# Patient Record
Sex: Female | Born: 1990 | Race: Black or African American | Hispanic: No | Marital: Single | Smoking: Never smoker
Health system: Southern US, Community
[De-identification: ages and names within clinical notes are randomized; demographics above are authoritative.]

## PROBLEM LIST (undated history)

## (undated) DIAGNOSIS — J45909 Unspecified asthma, uncomplicated: Secondary | ICD-10-CM

## (undated) DIAGNOSIS — I10 Essential (primary) hypertension: Secondary | ICD-10-CM

## (undated) DIAGNOSIS — R87629 Unspecified abnormal cytological findings in specimens from vagina: Secondary | ICD-10-CM

## (undated) DIAGNOSIS — T7840XA Allergy, unspecified, initial encounter: Secondary | ICD-10-CM

## (undated) DIAGNOSIS — O219 Vomiting of pregnancy, unspecified: Secondary | ICD-10-CM

## (undated) DIAGNOSIS — Z3490 Encounter for supervision of normal pregnancy, unspecified, unspecified trimester: Secondary | ICD-10-CM

## (undated) HISTORY — DX: Vomiting of pregnancy, unspecified: O21.9

## (undated) HISTORY — DX: Encounter for supervision of normal pregnancy, unspecified, unspecified trimester: Z34.90

## (undated) HISTORY — DX: Unspecified abnormal cytological findings in specimens from vagina: R87.629

## (undated) HISTORY — DX: Essential (primary) hypertension: I10

## (undated) HISTORY — DX: Unspecified asthma, uncomplicated: J45.909

## (undated) HISTORY — DX: Allergy, unspecified, initial encounter: T78.40XA

---

## 2014-02-09 NOTE — L&D Delivery Note (Cosign Needed)
Delivery Note At 6:40 AM a viable female was delivered via Vaginal, Spontaneous Delivery (Presentation: OA ).  APGAR: , 1; weight 5 lb 2.9 oz (2350 g).  Infant dried and lifted to pt's abd. Heartrate palp in intact cord approx 100bpm. Cord clamped and cut by CNM and infant taken to warmer. Placenta status: Intact, Spontaneous.  Cord: 3 vessels   Cord pH: 7.21  Anesthesia: Epidural  Episiotomy: None Lacerations:  1st degree vag Suture Repair: 3.0 vicryl Est. Blood Loss (mL):  approx 300cc  Mom to AICU.  Baby to NICU.   Cam Hai CNM 10/11/2014, 7:18 AM

## 2014-04-17 ENCOUNTER — Encounter: Payer: Self-pay | Admitting: Adult Health

## 2014-04-17 ENCOUNTER — Ambulatory Visit (INDEPENDENT_AMBULATORY_CARE_PROVIDER_SITE_OTHER): Payer: BLUE CROSS/BLUE SHIELD | Admitting: Adult Health

## 2014-04-17 VITALS — BP 152/90 | HR 87 | Ht 65.0 in | Wt 267.0 lb

## 2014-04-17 DIAGNOSIS — O219 Vomiting of pregnancy, unspecified: Secondary | ICD-10-CM

## 2014-04-17 DIAGNOSIS — Z3201 Encounter for pregnancy test, result positive: Secondary | ICD-10-CM

## 2014-04-17 DIAGNOSIS — Z3490 Encounter for supervision of normal pregnancy, unspecified, unspecified trimester: Secondary | ICD-10-CM

## 2014-04-17 DIAGNOSIS — O09899 Supervision of other high risk pregnancies, unspecified trimester: Secondary | ICD-10-CM | POA: Insufficient documentation

## 2014-04-17 HISTORY — DX: Encounter for supervision of normal pregnancy, unspecified, unspecified trimester: Z34.90

## 2014-04-17 HISTORY — DX: Vomiting of pregnancy, unspecified: O21.9

## 2014-04-17 LAB — POCT URINE PREGNANCY: Preg Test, Ur: POSITIVE

## 2014-04-17 MED ORDER — DOXYLAMINE-PYRIDOXINE 10-10 MG PO TBEC: DELAYED_RELEASE_TABLET | ORAL | Status: DC

## 2014-04-17 MED ORDER — PRENATAL PLUS IRON 29-1 MG PO TABS: ORAL_TABLET | ORAL | Status: DC

## 2014-04-17 NOTE — Patient Instructions (Signed)
Second Trimester of Pregnancy The second trimester is from week 13 through week 28, months 4 through 6. The second trimester is often a time when you feel your best. Your body has also adjusted to being pregnant, and you begin to feel better physically. Usually, morning sickness has lessened or quit completely, you may have more energy, and you may have an increase in appetite. The second trimester is also a time when the fetus is growing rapidly. At the end of the sixth month, the fetus is about 9 inches long and weighs about 1 pounds. You will likely begin to feel the baby move (quickening) between 18 and 20 weeks of the pregnancy. BODY CHANGES Your body goes through many changes during pregnancy. The changes vary from woman to woman.   Your weight will continue to increase. You will notice your lower abdomen bulging out.  You may begin to get stretch marks on your hips, abdomen, and breasts.  You may develop headaches that can be relieved by medicines approved by your health care provider.  You may urinate more often because the fetus is pressing on your bladder.  You may develop or continue to have heartburn as a result of your pregnancy.  You may develop constipation because certain hormones are causing the muscles that push waste through your intestines to slow down.  You may develop hemorrhoids or swollen, bulging veins (varicose veins).  You may have back pain because of the weight gain and pregnancy hormones relaxing your joints between the bones in your pelvis and as a result of a shift in weight and the muscles that support your balance.  Your breasts will continue to grow and be tender.  Your gums may bleed and may be sensitive to brushing and flossing.  Dark spots or blotches (chloasma, mask of pregnancy) may develop on your face. This will likely fade after the baby is born.  A dark line from your belly button to the pubic area (linea nigra) may appear. This will likely fade  after the baby is born.  You may have changes in your hair. These can include thickening of your hair, rapid growth, and changes in texture. Some women also have hair loss during or after pregnancy, or hair that feels dry or thin. Your hair will most likely return to normal after your baby is born. WHAT TO EXPECT AT YOUR PRENATAL VISITS During a routine prenatal visit:  You will be weighed to make sure you and the fetus are growing normally.  Your blood pressure will be taken.  Your abdomen will be measured to track your baby's growth.  The fetal heartbeat will be listened to.  Any test results from the previous visit will be discussed. Your health care provider may ask you:  How you are feeling.  If you are feeling the baby move.  If you have had any abnormal symptoms, such as leaking fluid, bleeding, severe headaches, or abdominal cramping.  If you have any questions. Other tests that may be performed during your second trimester include:  Blood tests that check for:  Low iron levels (anemia).  Gestational diabetes (between 24 and 28 weeks).  Rh antibodies.  Urine tests to check for infections, diabetes, or protein in the urine.  An ultrasound to confirm the proper growth and development of the baby.  An amniocentesis to check for possible genetic problems.  Fetal screens for spina bifida and Down syndrome. HOME CARE INSTRUCTIONS   Avoid all smoking, herbs, alcohol, and unprescribed   drugs. These chemicals affect the formation and growth of the baby.  Follow your health care provider's instructions regarding medicine use. There are medicines that are either safe or unsafe to take during pregnancy.  Exercise only as directed by your health care provider. Experiencing uterine cramps is a good sign to stop exercising.  Continue to eat regular, healthy meals.  Wear a good support bra for breast tenderness.  Do not use hot tubs, steam rooms, or saunas.  Wear your  seat belt at all times when driving.  Avoid raw meat, uncooked cheese, cat litter boxes, and soil used by cats. These carry germs that can cause birth defects in the baby.  Take your prenatal vitamins.  Try taking a stool softener (if your health care provider approves) if you develop constipation. Eat more high-fiber foods, such as fresh vegetables or fruit and whole grains. Drink plenty of fluids to keep your urine clear or pale yellow.  Take warm sitz baths to soothe any pain or discomfort caused by hemorrhoids. Use hemorrhoid cream if your health care provider approves.  If you develop varicose veins, wear support hose. Elevate your feet for 15 minutes, 3-4 times a day. Limit salt in your diet.  Avoid heavy lifting, wear low heel shoes, and practice good posture.  Rest with your legs elevated if you have leg cramps or low back pain.  Visit your dentist if you have not gone yet during your pregnancy. Use a soft toothbrush to brush your teeth and be gentle when you floss.  A sexual relationship may be continued unless your health care provider directs you otherwise.  Continue to go to all your prenatal visits as directed by your health care provider. SEEK MEDICAL CARE IF:   You have dizziness.  You have mild pelvic cramps, pelvic pressure, or nagging pain in the abdominal area.  You have persistent nausea, vomiting, or diarrhea.  You have a bad smelling vaginal discharge.  You have pain with urination. SEEK IMMEDIATE MEDICAL CARE IF:   You have a fever.  You are leaking fluid from your vagina.  You have spotting or bleeding from your vagina.  You have severe abdominal cramping or pain.  You have rapid weight gain or loss.  You have shortness of breath with chest pain.  You notice sudden or extreme swelling of your face, hands, ankles, feet, or legs.  You have not felt your baby move in over an hour.  You have severe headaches that do not go away with  medicine.  You have vision changes. Document Released: 01/20/2001 Document Revised: 01/31/2013 Document Reviewed: 03/29/2012 [employer] Patient Information 2015 [employer]. This information is not intended to replace advice given to you by your health care provider. Make sure you discuss any questions you have with your health care provider. First Trimester of Pregnancy The first trimester of pregnancy is from week 1 until the end of week 12 (months 1 through 3). A week after a sperm fertilizes an egg, the egg will implant on the wall of the uterus. This embryo will begin to develop into a baby. Genes from you and your partner are forming the baby. The female genes determine whether the baby is a boy or a girl. At 6-8 weeks, the eyes and face are formed, and the heartbeat can be seen on ultrasound. At the end of 12 weeks, all the baby's organs are formed.  Now that you are pregnant, you will want to do everything you can to have   a healthy baby. Two of the most important things are to get good prenatal care and to follow your health care provider's instructions. Prenatal care is all the medical care you receive before the baby's birth. This care will help prevent, find, and treat any problems during the pregnancy and childbirth. BODY CHANGES Your body goes through many changes during pregnancy. The changes vary from woman to woman.   You may gain or lose a couple of pounds at first.  You may feel sick to your stomach (nauseous) and throw up (vomit). If the vomiting is uncontrollable, call your health care provider.  You may tire easily.  You may develop headaches that can be relieved by medicines approved by your health care provider.  You may urinate more often. Painful urination may mean you have a bladder infection.  You may develop heartburn as a result of your pregnancy.  You may develop constipation because certain hormones are causing the muscles that push waste through your intestines  to slow down.  You may develop hemorrhoids or swollen, bulging veins (varicose veins).  Your breasts may begin to grow larger and become tender. Your nipples may stick out more, and the tissue that surrounds them (areola) may become darker.  Your gums may bleed and may be sensitive to brushing and flossing.  Dark spots or blotches (chloasma, mask of pregnancy) may develop on your face. This will likely fade after the baby is born.  Your menstrual periods will stop.  You may have a loss of appetite.  You may develop cravings for certain kinds of food.  You may have changes in your emotions from day to day, such as being excited to be pregnant or being concerned that something may go wrong with the pregnancy and baby.  You may have more vivid and strange dreams.  You may have changes in your hair. These can include thickening of your hair, rapid growth, and changes in texture. Some women also have hair loss during or after pregnancy, or hair that feels dry or thin. Your hair will most likely return to normal after your baby is born. WHAT TO EXPECT AT YOUR PRENATAL VISITS During a routine prenatal visit:  You will be weighed to make sure you and the baby are growing normally.  Your blood pressure will be taken.  Your abdomen will be measured to track your baby's growth.  The fetal heartbeat will be listened to starting around week 10 or 12 of your pregnancy.  Test results from any previous visits will be discussed. Your health care provider may ask you:  How you are feeling.  If you are feeling the baby move.  If you have had any abnormal symptoms, such as leaking fluid, bleeding, severe headaches, or abdominal cramping.  If you have any questions. Other tests that may be performed during your first trimester include:  Blood tests to find your blood type and to check for the presence of any previous infections. They will also be used to check for low iron levels (anemia) and  Rh antibodies. Later in the pregnancy, blood tests for diabetes will be done along with other tests if problems develop.  Urine tests to check for infections, diabetes, or protein in the urine.  An ultrasound to confirm the proper growth and development of the baby.  An amniocentesis to check for possible genetic problems.  Fetal screens for spina bifida and Down syndrome.  You may need other tests to make sure you and the baby   are doing well. HOME CARE INSTRUCTIONS  Medicines  Follow your health care provider's instructions regarding medicine use. Specific medicines may be either safe or unsafe to take during pregnancy.  Take your prenatal vitamins as directed.  If you develop constipation, try taking a stool softener if your health care provider approves. Diet  Eat regular, well-balanced meals. Choose a variety of foods, such as meat or vegetable-based protein, fish, milk and low-fat dairy products, vegetables, fruits, and whole grain breads and cereals. Your health care provider will help you determine the amount of weight gain that is right for you.  Avoid raw meat and uncooked cheese. These carry germs that can cause birth defects in the baby.  Eating four or five small meals rather than three large meals a day may help relieve nausea and vomiting. If you start to feel nauseous, eating a few soda crackers can be helpful. Drinking liquids between meals instead of during meals also seems to help nausea and vomiting.  If you develop constipation, eat more high-fiber foods, such as fresh vegetables or fruit and whole grains. Drink enough fluids to keep your urine clear or pale yellow. Activity and Exercise  Exercise only as directed by your health care provider. Exercising will help you:  Control your weight.  Stay in shape.  Be prepared for labor and delivery.  Experiencing pain or cramping in the lower abdomen or low back is a good sign that you should stop exercising. Check  with your health care provider before continuing normal exercises.  Try to avoid standing for long periods of time. Move your legs often if you must stand in one place for a long time.  Avoid heavy lifting.  Wear low-heeled shoes, and practice good posture.  You may continue to have sex unless your health care provider directs you otherwise. Relief of Pain or Discomfort  Wear a good support bra for breast tenderness.   Take warm sitz baths to soothe any pain or discomfort caused by hemorrhoids. Use hemorrhoid cream if your health care provider approves.   Rest with your legs elevated if you have leg cramps or low back pain.  If you develop varicose veins in your legs, wear support hose. Elevate your feet for 15 minutes, 3-4 times a day. Limit salt in your diet. Prenatal Care  Schedule your prenatal visits by the twelfth week of pregnancy. They are usually scheduled monthly at first, then more often in the last 2 months before delivery.  Write down your questions. Take them to your prenatal visits.  Keep all your prenatal visits as directed by your health care provider. Safety  Wear your seat belt at all times when driving.  Make a list of emergency phone numbers, including numbers for family, friends, the hospital, and police and fire departments. General Tips  Ask your health care provider for a referral to a local prenatal education class. Begin classes no later than at the beginning of month 6 of your pregnancy.  Ask for help if you have counseling or nutritional needs during pregnancy. Your health care provider can offer advice or refer you to specialists for help with various needs.  Do not use hot tubs, steam rooms, or saunas.  Do not douche or use tampons or scented sanitary pads.  Do not cross your legs for long periods of time.  Avoid cat litter boxes and soil used by cats. These carry germs that can cause birth defects in the baby and possibly loss of the fetus    by miscarriage or stillbirth.  Avoid all smoking, herbs, alcohol, and medicines not prescribed by your health care provider. Chemicals in these affect the formation and growth of the baby.  Schedule a dentist appointment. At home, brush your teeth with a soft toothbrush and be gentle when you floss. SEEK MEDICAL CARE IF:   You have dizziness.  You have mild pelvic cramps, pelvic pressure, or nagging pain in the abdominal area.  You have persistent nausea, vomiting, or diarrhea.  You have a bad smelling vaginal discharge.  You have pain with urination.  You notice increased swelling in your face, hands, legs, or ankles. SEEK IMMEDIATE MEDICAL CARE IF:   You have a fever.  You are leaking fluid from your vagina.  You have spotting or bleeding from your vagina.  You have severe abdominal cramping or pain.  You have rapid weight gain or loss.  You vomit blood or material that looks like coffee grounds.  You are exposed to German measles and have never had them.  You are exposed to fifth disease or chickenpox.  You develop a severe headache.  You have shortness of breath.  You have any kind of trauma, such as from a fall or a car accident. Document Released: 01/20/2001 Document Revised: 06/12/2013 Document Reviewed: 12/06/2012 [employer] Patient Information 2015 [employer]. This information is not intended to replace advice given to you by your health care provider. Make sure you discuss any questions you have with your health care provider. Return in 1 week for dating [place] 

## 2014-04-17 NOTE — Progress Notes (Signed)
Subjective:     Patient ID: Guerry MinorsChristian Werst, female   DOB: 11-08-1990, 24 y.o.   MRN: [id number]  HPI Ephriam KnucklesChristian is a 24 year old black female in for having missed a period and had +HPT,she complains of some nausea and vomiting.  Review of Systems +missed period +nausea and vomiting, all other systems negative  Reviewed past medical,surgical, social and family history. Reviewed medications and allergies.     Objective:   Physical Exam BP 152/90 mmHg  Pulse 87  Ht 5\' 5"  (1.651 m)  Wt 267 lb (121.11 kg)  BMI 44.43 kg/m2  LMP 12/19/2015UPT +, about 11+[redacted] weeks pregnant with EDD 11/05/14 by LMP, medicaid form given, Skin warm and dry. Neck: mid line trachea, normal thyroid,  Lungs: clear to ausculation bilaterally. Cardiovascular: regular rate and rhythm.abdomen is soft and non tender.    Assessment:     Pregnant +UPT Nausea and vomiting of pregnancy    Plan:     Rx prenatal plus #30 1 daily with 11 refills Trial of diclegis #36 tabs given take 2 at bedtime for 2 nights then on third day 1 in am and 2 at HS then 4th day 1 in am, 1 in afternoon and 2 at hs, no more than 4 a day, lot [id number] exp 07/10/15 Return in 1 week for dating US Review handout on first and second trimester

## 2014-04-24 ENCOUNTER — Other Ambulatory Visit: Payer: BLUE CROSS/BLUE SHIELD

## 2014-04-26 ENCOUNTER — Ambulatory Visit (INDEPENDENT_AMBULATORY_CARE_PROVIDER_SITE_OTHER): Payer: BLUE CROSS/BLUE SHIELD

## 2014-04-26 ENCOUNTER — Other Ambulatory Visit: Payer: Self-pay | Admitting: Adult Health

## 2014-04-26 ENCOUNTER — Encounter: Payer: Self-pay | Admitting: Adult Health

## 2014-04-26 DIAGNOSIS — O3680X1 Pregnancy with inconclusive fetal viability, fetus 1: Secondary | ICD-10-CM

## 2014-04-26 DIAGNOSIS — Z3490 Encounter for supervision of normal pregnancy, unspecified, unspecified trimester: Secondary | ICD-10-CM

## 2014-04-26 NOTE — Progress Notes (Signed)
U/S-(12+5weeks)-single active fetus, CRL c/w LMP dates, cx appears closed, bilateral adnexa appears WNL, FHR-157 bpm, pt refused NT/IT screen,anterior Gr 0 placenta

## 2014-05-02 ENCOUNTER — Encounter: Payer: BLUE CROSS/BLUE SHIELD | Admitting: Advanced Practice Midwife

## 2014-05-16 ENCOUNTER — Encounter: Payer: BLUE CROSS/BLUE SHIELD | Admitting: Women's Health

## 2014-05-22 ENCOUNTER — Ambulatory Visit (INDEPENDENT_AMBULATORY_CARE_PROVIDER_SITE_OTHER): Payer: BLUE CROSS/BLUE SHIELD | Admitting: Advanced Practice Midwife

## 2014-05-22 ENCOUNTER — Other Ambulatory Visit (HOSPITAL_COMMUNITY)
Admission: RE | Admit: 2014-05-22 | Discharge: 2014-05-22 | Disposition: A | Discharge status: Home / Self Care | Payer: Medicaid Other | Source: Ambulatory Visit | Attending: Advanced Practice Midwife | Admitting: Advanced Practice Midwife

## 2014-05-22 ENCOUNTER — Encounter: Payer: Self-pay | Admitting: Advanced Practice Midwife

## 2014-05-22 VITALS — BP 130/78 | HR 76 | Wt 262.4 lb

## 2014-05-22 DIAGNOSIS — J45909 Unspecified asthma, uncomplicated: Secondary | ICD-10-CM | POA: Insufficient documentation

## 2014-05-22 DIAGNOSIS — O0932 Supervision of pregnancy with insufficient antenatal care, second trimester: Secondary | ICD-10-CM

## 2014-05-22 DIAGNOSIS — Z363 Encounter for antenatal screening for malformations: Secondary | ICD-10-CM

## 2014-05-22 DIAGNOSIS — Z124 Encounter for screening for malignant neoplasm of cervix: Secondary | ICD-10-CM

## 2014-05-22 DIAGNOSIS — R87810 Cervical high risk human papillomavirus (HPV) DNA test positive: Secondary | ICD-10-CM | POA: Insufficient documentation

## 2014-05-22 DIAGNOSIS — Z113 Encounter for screening for infections with a predominantly sexual mode of transmission: Secondary | ICD-10-CM | POA: Insufficient documentation

## 2014-05-22 DIAGNOSIS — O0930 Supervision of pregnancy with insufficient antenatal care, unspecified trimester: Secondary | ICD-10-CM | POA: Insufficient documentation

## 2014-05-22 DIAGNOSIS — Z3490 Encounter for supervision of normal pregnancy, unspecified, unspecified trimester: Secondary | ICD-10-CM

## 2014-05-22 DIAGNOSIS — Z0283 Encounter for blood-alcohol and blood-drug test: Secondary | ICD-10-CM

## 2014-05-22 DIAGNOSIS — Z331 Pregnant state, incidental: Secondary | ICD-10-CM

## 2014-05-22 DIAGNOSIS — A5901 Trichomonal vulvovaginitis: Secondary | ICD-10-CM

## 2014-05-22 DIAGNOSIS — Z1389 Encounter for screening for other disorder: Secondary | ICD-10-CM

## 2014-05-22 DIAGNOSIS — Z01411 Encounter for gynecological examination (general) (routine) with abnormal findings: Secondary | ICD-10-CM | POA: Diagnosis present

## 2014-05-22 DIAGNOSIS — Z369 Encounter for antenatal screening, unspecified: Secondary | ICD-10-CM

## 2014-05-22 DIAGNOSIS — Z1151 Encounter for screening for human papillomavirus (HPV): Secondary | ICD-10-CM | POA: Insufficient documentation

## 2014-05-22 DIAGNOSIS — Z3402 Encounter for supervision of normal first pregnancy, second trimester: Secondary | ICD-10-CM | POA: Diagnosis not present

## 2014-05-22 LAB — POCT URINALYSIS DIPSTICK
Blood, UA: NEGATIVE
GLUCOSE UA: NEGATIVE
KETONES UA: NEGATIVE
NITRITE UA: NEGATIVE
Protein, UA: 1

## 2014-05-22 LAB — OB RESULTS CONSOLE HIV ANTIBODY (ROUTINE TESTING): HIV: NONREACTIVE

## 2014-05-22 LAB — OB RESULTS CONSOLE RUBELLA ANTIBODY, IGM: Rubella: IMMUNE

## 2014-05-22 MED ORDER — METRONIDAZOLE 500 MG PO TABS: 2000.0000 mg | ORAL_TABLET | Freq: Once | ORAL | Status: DC

## 2014-05-22 NOTE — Patient Instructions (Addendum)
Safe Medications in Pregnancy   Acne: Benzoyl Peroxide Salicylic Acid  Backache/Headache: Tylenol: 2 regular strength every 4 hours OR              2 Extra strength every 6 hours  Colds/Coughs/Allergies: Benadryl (alcohol free) 25 mg every 6 hours as needed Breath right strips Claritin Cepacol throat lozenges Chloraseptic throat spray Cold-Eeze- up to three times per day Cough drops, alcohol free Flonase (by prescription only) Guaifenesin Mucinex Robitussin DM (plain only, alcohol free) Saline nasal spray/drops Sudafed (pseudoephedrine) & Actifed ** use only after [redacted] weeks gestation and if you do not have high blood pressure Tylenol Vicks Vaporub Zinc lozenges Zyrtec   Constipation: Colace Ducolax suppositories Fleet enema Glycerin suppositories Metamucil Milk of magnesia Miralax Senokot Smooth move tea  Diarrhea: Kaopectate Imodium A-D  *NO pepto Bismol  Hemorrhoids: Anusol Anusol HC Preparation H Tucks  Indigestion: Tums Maalox Mylanta Zantac  Pepcid  Insomnia: Benadryl (alcohol free) 25mg  every 6 hours as needed Tylenol PM Unisom, no Gelcaps  Leg Cramps: Tums MagGel  Nausea/Vomiting:  Bonine Dramamine Emetrol Ginger extract Sea bands Meclizine  Nausea medication to take during pregnancy:  Unisom (doxylamine succinate 25 mg tablets) Take one tablet daily at bedtime. If symptoms are not adequately controlled, the dose can be increased to a maximum recommended dose of two tablets daily (1/2 tablet in the morning, 1/2 tablet mid-afternoon and one at bedtime). Vitamin B6 100mg  tablets. Take one tablet twice a day (up to 200 mg per day).  Skin Rashes: Aveeno products Benadryl cream or 25mg  every 6 hours as needed Calamine Lotion 1% cortisone cream  Yeast infection: Gyne-lotrimin 7 Monistat 7   **If taking multiple medications, please check labels to avoid duplicating the same active ingredients **take medication as directed on  the label ** Do not exceed 4000 mg of tylenol in 24 hours **Do not take medications that contain aspirin or ibuprofen    Trichomoniasis Trichomoniasis is an infection caused by an organism called Trichomonas. The infection can affect both women and men. In women, the outer female genitalia and the vagina are affected. In men, the penis is mainly affected, but the prostate and other reproductive organs can also be involved. Trichomoniasis is a sexually transmitted infection (STI) and is most often passed to another person through sexual contact.  RISK FACTORS  Having unprotected sexual intercourse.  Having sexual intercourse with an infected partner. SIGNS AND SYMPTOMS  Symptoms of trichomoniasis in women include:  Abnormal gray-green frothy vaginal discharge.  Itching and irritation of the vagina.  Itching and irritation of the area outside the vagina. Symptoms of trichomoniasis in men include:   Penile discharge with or without pain.  Pain during urination. This results from inflammation of the urethra. DIAGNOSIS  Trichomoniasis may be found during a Pap test or physical exam. Your health care provider may use one of the following methods to help diagnose this infection:  Examining vaginal discharge under a microscope. For men, urethral discharge would be examined.  Testing the pH of the vagina with a test tape.  Using a vaginal swab test that checks for the Trichomonas organism. A test is available that provides results within a few minutes.  Doing a culture test for the organism. This is not usually needed. TREATMENT   You may be given medicine to fight the infection. Women should inform their health care provider if they could be or are pregnant. Some medicines used to treat the infection should not be taken during pregnancy.  Your health care provider may recommend over-the-counter medicines or creams to decrease itching or irritation.  Your sexual partner will need to  be treated if infected. HOME CARE INSTRUCTIONS   Take medicines only as directed by your health care provider.  Take over-the-counter medicine for itching or irritation as directed by your health care provider.  Do not have sexual intercourse while you have the infection.  Women should not douche or wear tampons while they have the infection.  Discuss your infection with your partner. Your partner may have gotten the infection from you, or you may have gotten it from your partner.  Have your sex partner get examined and treated if necessary.  Practice safe, informed, and protected sex.  See your health care provider for other STI testing. SEEK MEDICAL CARE IF:   You still have symptoms after you finish your medicine.  You develop abdominal pain.  You have pain when you urinate.  You have bleeding after sexual intercourse.  You develop a rash.  Your medicine makes you sick or makes you throw up (vomit). MAKE SURE YOU:  Understand these instructions.  Will watch your condition.  Will get help right away if you are not doing well or get worse. Document Released: 07/22/2000 Document Revised: 06/12/2013 Document Reviewed: 11/07/2012 [facility] Patient Information 2015 [place], [place]. This information is not intended to replace advice given to you by your health care provider. Make sure you discuss any questions you have with your health care provider.

## 2014-05-22 NOTE — Progress Notes (Signed)
  Subjective:    Heather Huff is a G1P0 4681w3d being seen today for her first obstetrical visit.  Her obstetrical history is significant [facility].  Pregnancy history fully reviewed.  Patient reports vaginal irritation since before she got pregnant.  C/O yellow vaginal discharge as well.    Filed Vitals:   05/22/14 1055  BP: 130/78  Pulse: 76  Weight: 262 lb 6.4 oz (119.024 kg)    HISTORY: OB History  Gravida Para Term Preterm AB SAB TAB Ectopic Multiple Living  1             # Outcome Date GA Lbr Len/2nd Weight Sex Delivery Anes PTL Lv  1 Current              Past Medical History  Diagnosis Date  . Asthma   . Allergy     seasonal  . Pregnant 04/17/2014  . Nausea and vomiting during pregnancy 04/17/2014   History reviewed. No pertinent past surgical history. Family History  Problem Relation Age of Onset  . Hypertension Mother   . Thyroid disease Mother   . Asthma Sister   . Allergies Brother   . Arthritis Maternal Grandmother   . Hypertension Maternal Grandmother      Exam       Pelvic Exam:    Perineum: Normal Perineum   Vulva: normal   Vagina:  normal mucosa, no palpable nodules.  Frothy yellow discharge and vagina very erythemous.  WET PREP:  TNTC WBC, + trich.  No yeast or clue   Uterus Normal, Gravid, FH: 16      Cervix: normal   Adnexa: Not palpable   Urinary:  urethral meatus normal    System: Breast:  normal appearance, no masses or tenderness   Skin: normal coloration and turgor, no rashes    Neurologic: oriented, normal, normal mood   Extremities: normal strength, tone, and muscle mass   HEENT PERRLA   Mouth/Teeth mucous membranes moist, normal dentition   Neck supple and no masses   Cardiovascular: regular rate and rhythm   Respiratory:  appears well, vitals normal, no respiratory distress, acyanotic   Abdomen: soft, non-tender;  FHR: 150          Assessment:    Pregnancy: G1P0 Patient Active Problem List   Diagnosis  Date Noted  . Late prenatal care 05/22/2014  . Pregnant 04/17/2014  . Nausea and vomiting during pregnancy 04/17/2014        Plan:     Initial labs drawn. Continue prenatal vitamins  Flagyl 2mg  PO for Pt and Partner Problem list reviewed and updated  Reviewed recommended weight gain based on pre-gravid BMI  Encouraged well-balanced diet Genetic Screening discussed Quad Screen: declined.  Ultrasound discussed; fetal survey: requested.  Follow up in 3 weeks for anatomy scan, POC trich  CRESENZO-DISHMAN,Iolanda Folson 05/22/2014

## 2014-05-24 LAB — CYTOLOGY - PAP

## 2014-05-24 LAB — URINE CULTURE: Organism ID, Bacteria: NO GROWTH

## 2014-05-28 LAB — URINALYSIS, ROUTINE W REFLEX MICROSCOPIC
BILIRUBIN UA: NEGATIVE
Glucose, UA: NEGATIVE
Nitrite, UA: NEGATIVE
PH UA: 5.5 (ref 5.0–7.5)
UUROB: 0.2 mg/dL (ref 0.2–1.0)

## 2014-05-28 LAB — MICROSCOPIC EXAMINATION
Casts: NONE SEEN /lpf
Epithelial Cells (non renal): >10 /hpf — AB (ref 0–10)
WBC, UA: >30 /hpf — AB (ref 0–?)

## 2014-05-28 LAB — PMP SCREEN PROFILE (10S), URINE
Amphetamine Screen, Ur: NEGATIVE ng/mL
BARBITURATE SCRN UR: NEGATIVE ng/mL
BENZODIAZEPINE SCREEN, URINE: NEGATIVE ng/mL
CANNABINOIDS UR QL SCN: NEGATIVE ng/mL
CREATININE(CRT), U: 324.9 mg/dL — AB (ref 20.0–300.0)
Cocaine(Metab.)Screen, Urine: NEGATIVE ng/mL
Methadone Scn, Ur: NEGATIVE ng/mL
OXYCODONE+OXYMORPHONE UR QL SCN: NEGATIVE ng/mL
Opiate Scrn, Ur: NEGATIVE ng/mL
PCP Scrn, Ur: NEGATIVE ng/mL
Ph of Urine: 5.6 (ref 4.5–8.9)
Propoxyphene, Screen: NEGATIVE ng/mL

## 2014-05-28 LAB — CBC
HEMATOCRIT: 33.7 % — AB (ref 34.0–46.6)
Hemoglobin: 10.6 g/dL — ABNORMAL LOW (ref 11.1–15.9)
MCH: 21.7 pg — AB (ref 26.6–33.0)
MCHC: 31.5 g/dL (ref 31.5–35.7)
MCV: 69 fL — ABNORMAL LOW (ref 79–97)
Platelets: 229 10*3/uL (ref 150–379)
RBC: 4.88 x10E6/uL (ref 3.77–5.28)
RDW: 22.5 % — ABNORMAL HIGH (ref 12.3–15.4)
WBC: 8.7 10*3/uL (ref 3.4–10.8)

## 2014-05-28 LAB — RUBELLA SCREEN: RUBELLA: 1.11 {index} (ref >0.99)

## 2014-05-28 LAB — CYSTIC FIBROSIS MUTATION 97: Interpretation: NOT DETECTED

## 2014-05-28 LAB — ABO/RH: Rh Factor: POSITIVE

## 2014-05-28 LAB — VARICELLA ZOSTER ANTIBODY, IGG: VARICELLA: 2097 {index} (ref >165)

## 2014-05-28 LAB — HEPATITIS B SURFACE ANTIGEN: Hepatitis B Surface Ag: NEGATIVE

## 2014-05-28 LAB — RPR: RPR: NONREACTIVE

## 2014-05-28 LAB — SICKLE CELL SCREEN: SICKLE CELL SCREEN: NEGATIVE

## 2014-05-28 LAB — HIV ANTIBODY (ROUTINE TESTING W REFLEX): HIV SCREEN 4TH GENERATION: NONREACTIVE

## 2014-05-28 LAB — ANTIBODY SCREEN: Antibody Screen: NEGATIVE

## 2014-05-30 ENCOUNTER — Telehealth: Payer: Self-pay | Admitting: Women's Health

## 2014-05-30 ENCOUNTER — Other Ambulatory Visit: Payer: Self-pay | Admitting: Advanced Practice Midwife

## 2014-05-30 DIAGNOSIS — A749 Chlamydial infection, unspecified: Secondary | ICD-10-CM | POA: Insufficient documentation

## 2014-05-30 DIAGNOSIS — A5901 Trichomonal vulvovaginitis: Secondary | ICD-10-CM

## 2014-05-30 DIAGNOSIS — IMO0002 Reserved for concepts with insufficient information to code with codable children: Secondary | ICD-10-CM

## 2014-05-30 DIAGNOSIS — O0932 Supervision of pregnancy with insufficient antenatal care, second trimester: Secondary | ICD-10-CM

## 2014-05-30 MED ORDER — AZITHROMYCIN 500 MG PO TABS: 1000.0000 mg | ORAL_TABLET | Freq: Every day | ORAL | Status: DC

## 2014-05-30 NOTE — Progress Notes (Unsigned)
+   CHl 05/30/2014  Azithromycin 1gm for pt and partner. POC one month

## 2014-05-30 NOTE — Telephone Encounter (Signed)
Pt aware of blood work results. Pt stated that she already knew about her pap smear and she was taking the medication that Drenda FreezeFran prescribed her.

## 2014-06-01 ENCOUNTER — Other Ambulatory Visit: Payer: Self-pay | Admitting: Advanced Practice Midwife

## 2014-06-01 DIAGNOSIS — Z363 Encounter for antenatal screening for malformations: Secondary | ICD-10-CM

## 2014-06-01 DIAGNOSIS — O99210 Obesity complicating pregnancy, unspecified trimester: Secondary | ICD-10-CM

## 2014-06-05 ENCOUNTER — Encounter: Payer: Self-pay | Admitting: Women's Health

## 2014-06-05 ENCOUNTER — Ambulatory Visit (INDEPENDENT_AMBULATORY_CARE_PROVIDER_SITE_OTHER): Payer: BLUE CROSS/BLUE SHIELD | Admitting: Women's Health

## 2014-06-05 ENCOUNTER — Ambulatory Visit (INDEPENDENT_AMBULATORY_CARE_PROVIDER_SITE_OTHER): Payer: BLUE CROSS/BLUE SHIELD

## 2014-06-05 VITALS — BP 132/80 | HR 88 | Wt 262.0 lb

## 2014-06-05 DIAGNOSIS — A749 Chlamydial infection, unspecified: Secondary | ICD-10-CM

## 2014-06-05 DIAGNOSIS — Z331 Pregnant state, incidental: Secondary | ICD-10-CM

## 2014-06-05 DIAGNOSIS — IMO0002 Reserved for concepts with insufficient information to code with codable children: Secondary | ICD-10-CM

## 2014-06-05 DIAGNOSIS — O99210 Obesity complicating pregnancy, unspecified trimester: Secondary | ICD-10-CM

## 2014-06-05 DIAGNOSIS — Z36 Encounter for antenatal screening of mother: Secondary | ICD-10-CM

## 2014-06-05 DIAGNOSIS — O0932 Supervision of pregnancy with insufficient antenatal care, second trimester: Secondary | ICD-10-CM

## 2014-06-05 DIAGNOSIS — Z3402 Encounter for supervision of normal first pregnancy, second trimester: Secondary | ICD-10-CM

## 2014-06-05 DIAGNOSIS — Z363 Encounter for antenatal screening for malformations: Secondary | ICD-10-CM

## 2014-06-05 DIAGNOSIS — Z1389 Encounter for screening for other disorder: Secondary | ICD-10-CM

## 2014-06-05 DIAGNOSIS — A5901 Trichomonal vulvovaginitis: Secondary | ICD-10-CM

## 2014-06-05 LAB — POCT URINALYSIS DIPSTICK
Blood, UA: NEGATIVE
Glucose, UA: NEGATIVE
KETONES UA: NEGATIVE
Nitrite, UA: NEGATIVE
Protein, UA: NEGATIVE

## 2014-06-05 NOTE — Progress Notes (Signed)
US  6282w3d measurements c/w dates,breech,ant pl 3 cm from pl,normal ov's,sdp of fluid 3.8cm,limited anatomy scan, limited view of spine,heart,feet,and cord insertion.

## 2014-06-05 NOTE — Progress Notes (Signed)
Low-risk OB appointment G1P0 7911w3d Estimated Date of Delivery: 11/03/14 BP 146/94 mmHg  Pulse 88  Wt 262 lb (118.842 kg)  LMP 01/27/2014 BP recheck 132/80 BP, weight, and urine reviewed.  Refer to obstetrical flow sheet for FH & FHR.  No fm yet. Denies cramping, lof, vb, or uti s/s. No h/o HTN. Unaware of +CT or abnormal pap- notified pt & partner of both. Pt had taken both rx's for flagyl b/c continued to have d/c. Partner has not taken rx for flagyl. Flagyl 2gm po x 1 [facility] [birth year], [facility]. Pt[facility]ner [facility] per notes in chart. No sex x 7d from time both take all medication. Will do POC for trich & CT next visit. Also needs colpo next visit.  Reviewed today's anatomy u/s- limited views- repeat at 28wks. Discussed warning s/s to report. Plan:  Continue routine obstetrical care  F/U in 4wks for OB appointment, CT & trich poc, and colpo

## 2014-06-15 ENCOUNTER — Telehealth: Payer: Self-pay | Admitting: *Deleted

## 2014-06-15 NOTE — Telephone Encounter (Signed)
Spoke with pt. Pt has had + trich and + CHL recently. Pt was treated but not sure if partner was treated. She has had sex. Pt has a vaginal discharge again. I advised she would need to be seen so discharge can be checked. Pt voiced understanding. Call transferred to front desk for appt. JSY

## 2014-06-18 ENCOUNTER — Other Ambulatory Visit: Payer: Self-pay | Admitting: Advanced Practice Midwife

## 2014-06-18 ENCOUNTER — Encounter: Payer: BLUE CROSS/BLUE SHIELD | Admitting: Women's Health

## 2014-06-22 ENCOUNTER — Other Ambulatory Visit: Payer: Self-pay | Admitting: Obstetrics & Gynecology

## 2014-06-22 ENCOUNTER — Ambulatory Visit (INDEPENDENT_AMBULATORY_CARE_PROVIDER_SITE_OTHER): Payer: BLUE CROSS/BLUE SHIELD

## 2014-06-22 ENCOUNTER — Ambulatory Visit (INDEPENDENT_AMBULATORY_CARE_PROVIDER_SITE_OTHER): Payer: BLUE CROSS/BLUE SHIELD | Admitting: Obstetrics & Gynecology

## 2014-06-22 VITALS — BP 140/90 | HR 76 | Wt 263.0 lb

## 2014-06-22 DIAGNOSIS — A749 Chlamydial infection, unspecified: Secondary | ICD-10-CM

## 2014-06-22 DIAGNOSIS — O0932 Supervision of pregnancy with insufficient antenatal care, second trimester: Secondary | ICD-10-CM

## 2014-06-22 DIAGNOSIS — O99212 Obesity complicating pregnancy, second trimester: Secondary | ICD-10-CM | POA: Diagnosis not present

## 2014-06-22 DIAGNOSIS — Z3402 Encounter for supervision of normal first pregnancy, second trimester: Secondary | ICD-10-CM

## 2014-06-22 DIAGNOSIS — IMO0002 Reserved for concepts with insufficient information to code with codable children: Secondary | ICD-10-CM

## 2014-06-22 DIAGNOSIS — Z1389 Encounter for screening for other disorder: Secondary | ICD-10-CM

## 2014-06-22 DIAGNOSIS — Z0489 Encounter for examination and observation for other specified reasons: Secondary | ICD-10-CM

## 2014-06-22 DIAGNOSIS — Z331 Pregnant state, incidental: Secondary | ICD-10-CM

## 2014-06-22 DIAGNOSIS — A5901 Trichomonal vulvovaginitis: Secondary | ICD-10-CM

## 2014-06-22 LAB — POCT URINALYSIS DIPSTICK
Blood, UA: NEGATIVE
GLUCOSE UA: NEGATIVE
Ketones, UA: NEGATIVE
Nitrite, UA: NEGATIVE
Protein, UA: 1

## 2014-06-22 NOTE — Progress Notes (Signed)
G1P0 6850w6d Estimated Date of Delivery: 11/03/14  Blood pressure 140/90, pulse 76, weight 263 lb (119.296 kg), last menstrual period 01/27/2014.   BP weight and urine results all reviewed and noted.  Please refer to the obstetrical flow sheet for the fundal height and fetal heart rate documentation:  Patient reports good fetal movement, denies any bleeding and no rupture of membranes symptoms or regular contractions. Patient is without complaints. All questions were answered.  Plan:  Continued routine obstetrical care,   Follow up in 3 weeks for OB appointment, colposcopy, TOC  Sonogram is read report done

## 2014-06-22 NOTE — Progress Notes (Signed)
US [redacted]W[redacted]D measurements c/w dates,post pl, pos fht 158bpm,afi sdp 3.8cm,efw 368g,normal ov's bilat,limited view of spine,CI and heels,?prominent stomach.

## 2014-07-03 ENCOUNTER — Encounter: Payer: BLUE CROSS/BLUE SHIELD | Admitting: Women's Health

## 2014-07-13 ENCOUNTER — Ambulatory Visit (INDEPENDENT_AMBULATORY_CARE_PROVIDER_SITE_OTHER): Payer: BLUE CROSS/BLUE SHIELD | Admitting: Obstetrics & Gynecology

## 2014-07-13 ENCOUNTER — Encounter: Payer: BLUE CROSS/BLUE SHIELD | Admitting: Obstetrics & Gynecology

## 2014-07-13 ENCOUNTER — Encounter: Payer: Self-pay | Admitting: Obstetrics & Gynecology

## 2014-07-13 VITALS — BP 130/90 | HR 75 | Wt 264.0 lb

## 2014-07-13 DIAGNOSIS — Z331 Pregnant state, incidental: Secondary | ICD-10-CM

## 2014-07-13 DIAGNOSIS — Z1389 Encounter for screening for other disorder: Secondary | ICD-10-CM

## 2014-07-13 DIAGNOSIS — Z3402 Encounter for supervision of normal first pregnancy, second trimester: Secondary | ICD-10-CM

## 2014-07-13 DIAGNOSIS — O359XX1 Maternal care for (suspected) fetal abnormality and damage, unspecified, fetus 1: Secondary | ICD-10-CM

## 2014-07-13 LAB — POCT URINALYSIS DIPSTICK
Blood, UA: NEGATIVE
Glucose, UA: NEGATIVE
Ketones, UA: NEGATIVE
NITRITE UA: NEGATIVE

## 2014-07-13 MED ORDER — TERCONAZOLE 0.4 % VA CREA: 1.0000 | TOPICAL_CREAM | Freq: Every day | VAGINAL | Status: DC

## 2014-07-13 NOTE — Addendum Note (Signed)
Addended by: Federico FlakeNES, PEGGY A on: 07/13/2014 11:43 AM   Modules accepted: Medications

## 2014-07-13 NOTE — Addendum Note (Signed)
Addended by: Criss AlvinePULLIAM, Tachina Spoonemore G on: 07/13/2014 11:35 AM   Modules accepted: Orders

## 2014-07-13 NOTE — Progress Notes (Signed)
Colpo could not be performed today due to severe yeast infection, will try again next time    Needs sonogram repeated due to prominent stomach bubble along with her 2 hour GTT  G1P0 7951w6d Estimated Date of Delivery: 11/03/14  Last menstrual period 01/27/2014.   BP weight and urine results all reviewed and noted.  Please refer to the obstetrical flow sheet for the fundal height and fetal heart rate documentation:  Patient reports good fetal movement, denies any bleeding and no rupture of membranes symptoms or regular contractions. Patient is without complaints. All questions were answered.  Plan:  Continued routine obstetrical care,   Follow up in 4 weeks for OB appointment, sonogram PN2 and colpo

## 2014-07-16 NOTE — Progress Notes (Signed)
This encounter was created in error - please disregard.

## 2014-08-14 ENCOUNTER — Other Ambulatory Visit: Payer: BLUE CROSS/BLUE SHIELD

## 2014-08-14 ENCOUNTER — Encounter: Payer: Self-pay | Admitting: Obstetrics & Gynecology

## 2014-08-14 ENCOUNTER — Ambulatory Visit (INDEPENDENT_AMBULATORY_CARE_PROVIDER_SITE_OTHER): Payer: BLUE CROSS/BLUE SHIELD

## 2014-08-14 ENCOUNTER — Ambulatory Visit (INDEPENDENT_AMBULATORY_CARE_PROVIDER_SITE_OTHER): Payer: BLUE CROSS/BLUE SHIELD | Admitting: Obstetrics & Gynecology

## 2014-08-14 VITALS — BP 142/98 | HR 84 | Wt 267.0 lb

## 2014-08-14 DIAGNOSIS — O359XX1 Maternal care for (suspected) fetal abnormality and damage, unspecified, fetus 1: Secondary | ICD-10-CM

## 2014-08-14 DIAGNOSIS — A749 Chlamydial infection, unspecified: Secondary | ICD-10-CM

## 2014-08-14 DIAGNOSIS — Z131 Encounter for screening for diabetes mellitus: Secondary | ICD-10-CM

## 2014-08-14 DIAGNOSIS — Z3402 Encounter for supervision of normal first pregnancy, second trimester: Secondary | ICD-10-CM

## 2014-08-14 DIAGNOSIS — A5901 Trichomonal vulvovaginitis: Secondary | ICD-10-CM

## 2014-08-14 DIAGNOSIS — Z3493 Encounter for supervision of normal pregnancy, unspecified, third trimester: Secondary | ICD-10-CM

## 2014-08-14 DIAGNOSIS — Z369 Encounter for antenatal screening, unspecified: Secondary | ICD-10-CM

## 2014-08-14 DIAGNOSIS — Z1389 Encounter for screening for other disorder: Secondary | ICD-10-CM

## 2014-08-14 DIAGNOSIS — Z331 Pregnant state, incidental: Secondary | ICD-10-CM

## 2014-08-14 DIAGNOSIS — O0932 Supervision of pregnancy with insufficient antenatal care, second trimester: Secondary | ICD-10-CM

## 2014-08-14 LAB — POCT URINALYSIS DIPSTICK
Blood, UA: NEGATIVE
Glucose, UA: NEGATIVE
LEUKOCYTES UA: NEGATIVE
Nitrite, UA: NEGATIVE

## 2014-08-14 NOTE — Progress Notes (Signed)
G1P0 372w3d Estimated Date of Delivery: 11/03/14  Blood pressure 142/98, pulse 84, weight 267 lb (121.11 kg), last menstrual period 01/27/2014.   BP weight and urine results all reviewed and noted.  Please refer to the obstetrical flow sheet for the fundal height and fetal heart rate documentation:  Patient reports good fetal movement, denies any bleeding and no rupture of membranes symptoms or regular contractions. Patient is without complaints. All questions were answered.  Plan:  Continued routine obstetrical care, watch BP, borderline chronic  Follow up in 2 weeks for OB appointment,   Could not do colpo long engorged vagina repeat attempt at 8 weeks post partum

## 2014-08-14 NOTE — Progress Notes (Signed)
Pt denies any problems or concerns at this time.  

## 2014-08-14 NOTE — Progress Notes (Signed)
US 28+3WKS,measurements c/w dates,efw 1273g 56.1%,cephalic,normal ov's bilat,afi 11cm,fht 152bpm,cx 4.2cm,ant pl gr 1,stomach appears to be wnl

## 2014-08-15 LAB — CBC
HEMOGLOBIN: 11.1 g/dL (ref 11.1–15.9)
Hematocrit: 34.9 % (ref 34.0–46.6)
MCH: 24.8 pg — AB (ref 26.6–33.0)
MCHC: 31.8 g/dL (ref 31.5–35.7)
MCV: 78 fL — AB (ref 79–97)
Platelets: 178 10*3/uL (ref 150–379)
RBC: 4.47 x10E6/uL (ref 3.77–5.28)
RDW: 21 % — ABNORMAL HIGH (ref 12.3–15.4)
WBC: 9.8 10*3/uL (ref 3.4–10.8)

## 2014-08-15 LAB — GLUCOSE TOLERANCE, 2 HOURS W/ 1HR
Glucose, 1 hour: 143 mg/dL (ref 65–179)
Glucose, 2 hour: 131 mg/dL (ref 65–152)
Glucose, Fasting: 76 mg/dL (ref 65–91)

## 2014-08-15 LAB — ANTIBODY SCREEN: ANTIBODY SCREEN: NEGATIVE

## 2014-08-15 LAB — HSV 2 ANTIBODY, IGG: HSV 2 Glycoprotein G Ab, IgG: <0.91 index (ref 0.00–0.90)

## 2014-08-15 LAB — HIV ANTIBODY (ROUTINE TESTING W REFLEX): HIV Screen 4th Generation wRfx: NONREACTIVE

## 2014-08-15 LAB — RPR: RPR Ser Ql: NONREACTIVE

## 2014-08-28 ENCOUNTER — Encounter: Payer: BLUE CROSS/BLUE SHIELD | Admitting: Women's Health

## 2014-08-29 ENCOUNTER — Ambulatory Visit (INDEPENDENT_AMBULATORY_CARE_PROVIDER_SITE_OTHER): Payer: BLUE CROSS/BLUE SHIELD | Admitting: Women's Health

## 2014-08-29 ENCOUNTER — Encounter: Payer: Self-pay | Admitting: Women's Health

## 2014-08-29 VITALS — BP 144/98 | HR 80 | Wt 268.0 lb

## 2014-08-29 DIAGNOSIS — Z331 Pregnant state, incidental: Secondary | ICD-10-CM

## 2014-08-29 DIAGNOSIS — A749 Chlamydial infection, unspecified: Secondary | ICD-10-CM

## 2014-08-29 DIAGNOSIS — A599 Trichomoniasis, unspecified: Secondary | ICD-10-CM | POA: Diagnosis not present

## 2014-08-29 DIAGNOSIS — Z1389 Encounter for screening for other disorder: Secondary | ICD-10-CM

## 2014-08-29 DIAGNOSIS — O98819 Other maternal infectious and parasitic diseases complicating pregnancy, unspecified trimester: Secondary | ICD-10-CM

## 2014-08-29 DIAGNOSIS — O98319 Other infections with a predominantly sexual mode of transmission complicating pregnancy, unspecified trimester: Secondary | ICD-10-CM

## 2014-08-29 DIAGNOSIS — R896 Abnormal cytological findings in specimens from other organs, systems and tissues: Secondary | ICD-10-CM

## 2014-08-29 DIAGNOSIS — O10919 Unspecified pre-existing hypertension complicating pregnancy, unspecified trimester: Secondary | ICD-10-CM

## 2014-08-29 DIAGNOSIS — A5901 Trichomonal vulvovaginitis: Secondary | ICD-10-CM

## 2014-08-29 DIAGNOSIS — O10912 Unspecified pre-existing hypertension complicating pregnancy, second trimester: Secondary | ICD-10-CM

## 2014-08-29 DIAGNOSIS — O09893 Supervision of other high risk pregnancies, third trimester: Secondary | ICD-10-CM

## 2014-08-29 DIAGNOSIS — IMO0002 Reserved for concepts with insufficient information to code with codable children: Secondary | ICD-10-CM

## 2014-08-29 LAB — POCT URINALYSIS DIPSTICK
Blood, UA: NEGATIVE
GLUCOSE UA: NEGATIVE
KETONES UA: NEGATIVE
NITRITE UA: NEGATIVE
Protein, UA: NEGATIVE

## 2014-08-29 LAB — POCT WET PREP (WET MOUNT): Clue Cells Wet Prep Whiff POC: POSITIVE

## 2014-08-29 LAB — OB RESULTS CONSOLE GC/CHLAMYDIA
Chlamydia: NEGATIVE
Gonorrhea: NEGATIVE

## 2014-08-29 MED ORDER — METRONIDAZOLE 500 MG PO TABS: 500.0000 mg | ORAL_TABLET | Freq: Two times a day (BID) | ORAL | Status: DC

## 2014-08-29 NOTE — Patient Instructions (Addendum)
Call the office [phone]) or go to [facility] if:  You begin to have strong, frequent contractions  Your water breaks.  Sometimes it is a big gush of fluid, sometimes it is just a trickle that keeps getting your panties wet or running down your legs  You have vaginal bleeding.  It is normal to have a small amount of spotting if your cervix was checked.   You don't feel your baby moving like normal.  If you don't, get you something to eat and drink and lay down and focus on feeling your baby move.  You should feel at least 10 movements in 2 hours.  If you don't, you should call the office or go to [facility].    [place] Pediatricians/Family Doctors:  [facility] [phone]            [facility] [phone]                 [facility] [phone] (usually not accepting new patients unless you have family there already, you are always welcome to call and ask)            [facility] ([address] [place]) [phone]   [facility]:   [facility]: [phone]  [facility]: [phone]   Third Trimester of Pregnancy The third trimester is from week 29 through week 42, months 7 through 9. The third trimester is a time when the fetus is growing rapidly. At the end of the ninth month, the fetus is about 20 inches in length and weighs 6-10 pounds.  BODY CHANGES Your body goes through many changes during pregnancy. The changes vary from woman to woman.  6. Your weight will continue to increase. You can expect to gain 25-35 pounds (11-16 kg) by the end of the pregnancy. 7. You may begin to get stretch marks on your hips, abdomen, and breasts. 8. You may urinate more often because the fetus is moving lower into your pelvis and pressing on your bladder. 9. You may develop or continue to have heartburn as a result of your pregnancy. 10. You may develop constipation  because certain hormones are causing the muscles that push waste through your intestines to slow down. 11. You may develop hemorrhoids or swollen, bulging veins (varicose veins). 12. You may have pelvic pain because of the weight gain and pregnancy hormones relaxing your joints between the bones in your pelvis. Backaches may result from overexertion of the muscles supporting your posture. 13. You may have changes in your hair. These can include thickening of your hair, rapid growth, and changes in texture. Some women also have hair loss during or after pregnancy, or hair that feels dry or thin. Your hair will most likely return to normal after your baby is born. 14. Your breasts will continue to grow and be tender. A yellow discharge may leak from your breasts called colostrum. 15. Your belly button may stick out. 16. You may feel short of breath because of your expanding uterus. 17. You may notice the fetus "dropping," or moving lower in your abdomen. 18. You may have a bloody mucus discharge. This usually occurs a few days to a week before labor begins. 19. Your cervix becomes thin and soft (effaced) near your due date. WHAT TO EXPECT AT YOUR PRENATAL EXAMS  You will have prenatal exams every 2 weeks until week 36. Then, you will have weekly prenatal exams. During a routine prenatal visit: 2.  You will be weighed to make sure you and the fetus are growing normally. 3. Your blood pressure is taken. 4. Your abdomen will be measured to track your baby's growth. 5. The fetal heartbeat will be listened to. 6. Any test results from the previous visit will be discussed. 7. You may have a cervical check near your due date to see if you have effaced. At around 36 weeks, your caregiver will check your cervix. At the same time, your caregiver will also perform a test on the secretions of the vaginal tissue. This test is to determine if a type of bacteria, Group B streptococcus, is present. Your caregiver  will explain this further. Your caregiver may ask you: 2. What your birth plan is. 3. How you are feeling. 4. If you are feeling the baby move. 5. If you have had any abnormal symptoms, such as leaking fluid, bleeding, severe headaches, or abdominal cramping. 6. If you have any questions. Other tests or screenings that may be performed during your third trimester include: 2. Blood tests that check for low iron levels (anemia). 3. Fetal testing to check the health, activity level, and growth of the fetus. Testing is done if you have certain medical conditions or if there are problems during the pregnancy. FALSE LABOR You may feel small, irregular contractions that eventually go away. These are called Braxton Hicks contractions, or false labor. Contractions may last for hours, days, or even weeks before true labor sets in. If contractions come at regular intervals, intensify, or become painful, it is best to be seen by your caregiver.  SIGNS OF LABOR  3. Menstrual-like cramps. 4. Contractions that are 5 minutes apart or less. 5. Contractions that start on the top of the uterus and spread down to the lower abdomen and back. 6. A sense of increased pelvic pressure or back pain. 7. A watery or bloody mucus discharge that comes from the vagina. If you have any of these signs before the 37th week of pregnancy, call your caregiver right away. You need to go to the hospital to get checked immediately. HOME CARE INSTRUCTIONS   Avoid all smoking, herbs, alcohol, and unprescribed drugs. These chemicals affect the formation and growth of the baby.  Follow your caregiver's instructions regarding medicine use. There are medicines that are either safe or unsafe to take during pregnancy.  Exercise only as directed by your caregiver. Experiencing uterine cramps is a good sign to stop exercising.  Continue to eat regular, healthy meals.  Wear a good support bra for breast tenderness.  Do not use hot  tubs, steam rooms, or saunas.  Wear your seat belt at all times when driving.  Avoid raw meat, uncooked cheese, cat litter boxes, and soil used by cats. These carry germs that can cause birth defects in the baby.  Take your prenatal vitamins.  Try taking a stool softener (if your caregiver approves) if you develop constipation. Eat more high-fiber foods, such as fresh vegetables or fruit and whole grains. Drink plenty of fluids to keep your urine clear or pale yellow.  Take warm sitz baths to soothe any pain or discomfort caused by hemorrhoids. Use hemorrhoid cream if your caregiver approves.  If you develop varicose veins, wear support hose. Elevate your feet for 15 minutes, 3-4 times a day. Limit salt in your diet.  Avoid heavy lifting, wear low heal shoes, and practice good posture.  Rest a lot with your legs elevated if you have leg cramps or low  back pain.  Visit your dentist if you have not gone during your pregnancy. Use a soft toothbrush to brush your teeth and be gentle when you floss.  A sexual relationship may be continued unless your caregiver directs you otherwise.  Do not travel far distances unless it is absolutely necessary and only with the approval of your caregiver.  Take prenatal classes to understand, practice, and ask questions about the labor and delivery.  Make a trial run to the hospital.  Pack your hospital bag.  Prepare the baby's nursery.  Continue to go to all your prenatal visits as directed by your caregiver. SEEK MEDICAL CARE IF:  You are unsure if you are in labor or if your water has broken.  You have dizziness.  You have mild pelvic cramps, pelvic pressure, or nagging pain in your abdominal area.  You have persistent nausea, vomiting, or diarrhea.  You have a bad smelling vaginal discharge.  You have pain with urination. SEEK IMMEDIATE MEDICAL CARE IF:   You have a fever.  You are leaking fluid from your vagina.  You have spotting  or bleeding from your vagina.  You have severe abdominal cramping or pain.  You have rapid weight loss or gain.  You have shortness of breath with chest pain.  You notice sudden or extreme swelling of your face, hands, ankles, feet, or legs.  You have not felt your baby move in over an hour.  You have severe headaches that do not go away with medicine.  You have vision changes. Document Released: 01/20/2001 Document Revised: 01/31/2013 Document Reviewed: 03/29/2012 [facility] Patient Information 2015 [place]. This information is not intended to replace advice given to you by your health care provider. Make sure you discuss any questions you have with your health care provider.  Bacterial Vaginosis Bacterial vaginosis is a vaginal infection that occurs when the normal balance of bacteria in the vagina is disrupted. It results from an overgrowth of certain bacteria. This is the most common vaginal infection in women of childbearing age. Treatment is important to prevent complications, especially in pregnant women, as it can cause a premature delivery. CAUSES  Bacterial vaginosis is caused by an increase in harmful bacteria that are normally present in smaller amounts in the vagina. Several different kinds of bacteria can cause bacterial vaginosis. However, the reason that the condition develops is not fully understood. RISK FACTORS Certain activities or behaviors can put you at an increased risk of developing bacterial vaginosis, including: 20. Having a new sex partner or multiple sex partners. 21. Douching. 22. Using an intrauterine device (IUD) for contraception. Women do not get bacterial vaginosis from toilet seats, bedding, swimming pools, or contact with objects around them. SIGNS AND SYMPTOMS  Some women with bacterial vaginosis have no signs or symptoms. Common symptoms include: 8. Grey vaginal discharge. 9. A fishlike odor with discharge, especially after sexual  intercourse. 10. Itching or burning of the vagina and vulva. 11. Burning or pain with urination. DIAGNOSIS  Your health care provider will take a medical history and examine the vagina for signs of bacterial vaginosis. A sample of vaginal fluid may be taken. Your health care provider will look at this sample under a microscope to check for bacteria and abnormal cells. A vaginal pH test may also be done.  TREATMENT  Bacterial vaginosis may be treated with antibiotic medicines. These may be given in the form of a pill or a vaginal cream. A second round of antibiotics may be  prescribed if the condition comes back after treatment.  HOME CARE INSTRUCTIONS  7. Only take over-the-counter or prescription medicines as directed by your health care provider. 8. If antibiotic medicine was prescribed, take it as directed. Make sure you finish it even if you start to feel better. 9. Do not have sex until treatment is completed. 10. Tell all sexual partners that you have a vaginal infection. They should see their health care provider and be treated if they have problems, such as a mild rash or itching. 11. Practice safe sex by using condoms and only having one sex partner. SEEK MEDICAL CARE IF:  4. Your symptoms are not improving after 3 days of treatment. 5. You have increased discharge or pain. 6. You have a fever. MAKE SURE YOU:  8. Understand these instructions. 9. Will watch your condition. 10. Will get help right away if you are not doing well or get worse. FOR MORE INFORMATION  Centers for Disease Control and Prevention, Division of STD Prevention: [employer]s.co.za American Sexual Health Association (ASHA): www.ashastd.org  Document Released: 01/26/2005 Document Revised: 11/16/2012 Document Reviewed: 09/07/2012 [facility] Patient Information 2015 [place], [place]. This information is not intended to replace advice given to you by your health care provider. Make sure you discuss any questions you have  with your health care provider.

## 2014-08-29 NOTE — Progress Notes (Addendum)
High Risk Pregnancy Diagnosis(es): CHTN G1P0 173w4d Estimated Date of Delivery: 11/03/14 BP 144/98 mmHg  Pulse 80  Wt 268 lb (121.564 kg)  LMP 01/27/2014  Urinalysis: Negative HPI:  Leaking fluid x 2 wks BP, weight, and urine reviewed.  Reports good fm. Denies regular uc's, vb, uti s/s.   Fundal Height:  32 Fetal Heart rate:  159 Edema:  none SSE: cx visually closed, no pooling, no change w/ valsalva, mod amount thin yellow malodorous d/c, nitrazine & fern neg, wet prep neg trich, + clues=BV, rx flagyl 500mg  bid x 7d  Reviewed ptl s/s, pn2 results, fkc All questions were answered Assessment: 2273w4d CHTN Medication(s) Plans:  None today Treatment Plan:  U/s for efw/afi, begin 2x/wk testing at 32wks, deliver at 39wks (meds) or 40wks (no meds) Follow up in 2d for high-risk OB appt & u/s CT POC today, Trich POC today neg

## 2014-08-31 LAB — GC/CHLAMYDIA PROBE AMP
Chlamydia trachomatis, NAA: NEGATIVE
NEISSERIA GONORRHOEAE BY PCR: NEGATIVE

## 2014-09-02 ENCOUNTER — Other Ambulatory Visit: Payer: Self-pay | Admitting: Women's Health

## 2014-09-02 DIAGNOSIS — O163 Unspecified maternal hypertension, third trimester: Secondary | ICD-10-CM

## 2014-09-03 ENCOUNTER — Ambulatory Visit (INDEPENDENT_AMBULATORY_CARE_PROVIDER_SITE_OTHER): Payer: BLUE CROSS/BLUE SHIELD

## 2014-09-03 ENCOUNTER — Ambulatory Visit (INDEPENDENT_AMBULATORY_CARE_PROVIDER_SITE_OTHER): Payer: BLUE CROSS/BLUE SHIELD | Admitting: Obstetrics and Gynecology

## 2014-09-03 VITALS — BP 152/104 | HR 77 | Wt 269.5 lb

## 2014-09-03 DIAGNOSIS — O163 Unspecified maternal hypertension, third trimester: Secondary | ICD-10-CM

## 2014-09-03 DIAGNOSIS — Z331 Pregnant state, incidental: Secondary | ICD-10-CM

## 2014-09-03 DIAGNOSIS — A749 Chlamydial infection, unspecified: Secondary | ICD-10-CM

## 2014-09-03 DIAGNOSIS — O10913 Unspecified pre-existing hypertension complicating pregnancy, third trimester: Secondary | ICD-10-CM

## 2014-09-03 DIAGNOSIS — O0933 Supervision of pregnancy with insufficient antenatal care, third trimester: Secondary | ICD-10-CM

## 2014-09-03 DIAGNOSIS — A5901 Trichomonal vulvovaginitis: Secondary | ICD-10-CM

## 2014-09-03 DIAGNOSIS — Z1389 Encounter for screening for other disorder: Secondary | ICD-10-CM

## 2014-09-03 DIAGNOSIS — O09893 Supervision of other high risk pregnancies, third trimester: Secondary | ICD-10-CM

## 2014-09-03 LAB — POCT URINALYSIS DIPSTICK
Glucose, UA: NEGATIVE
Ketones, UA: NEGATIVE
Leukocytes, UA: NEGATIVE
NITRITE UA: NEGATIVE
RBC UA: NEGATIVE

## 2014-09-03 MED ORDER — LABETALOL HCL 200 MG PO TABS: 200.0000 mg | ORAL_TABLET | Freq: Two times a day (BID) | ORAL | Status: DC

## 2014-09-03 NOTE — Progress Notes (Signed)
Patient ID: Heather Huff, female   DOB: 03/13/1990, 24 y.o.   MRN: [id number]  G1P0 [redacted]w[redacted]d Estimated Date of Delivery: 11/03/14  Blood pressure 152/104, pulse 77, weight 269 lb 8 oz (122.244 kg), last menstrual period 01/27/2014.   refer to the ob flow sheet for FH and FHR, also BP, Wt, Urine results:notable for negative  Patient reports + good fetal movement, denies any bleeding and no rupture of membranes symptoms or regular contractions. Patient complaints: Pt is here today for evaluation for hypertension. She has had consistent elevated BP since the start of pregnancy. At this visit, her BP measured 152/104. Pt denies a history of HTN. She also denies fluid loss, contractions, vaginal bleeding, HA, swelling, nausea and vomiting.   FH-32 cm FHR-141 bpm  Questions were answered. AssessChronic HTN [redacted]w[redacted]d    Rule out Pre-E  Plan:  Continued routine obstetrical care. Check cbc, cmet, 24 hr tp Prescribe Labetalol 200 bid  F/u in 4 days for nst, bp check  This chart was scribed for Tilda Burrow, MD by Gwenyth Ober, Medical Scribe. This patient was seen in room 1 and the patient's care was started at 2:32 PM.   I personally performed the services described in this documentation, which was SCRIBED in my presence. The recorded information has been reviewed and considered accurate. It has been edited as necessary during review. Tilda Burrow, MD

## 2014-09-03 NOTE — Progress Notes (Signed)
[place] 31+2wks,EFW 1720g 46.7%,cephalic,ant pl gr 2,AFI 8.6cm,fht 132bpm,adnexa's wnl

## 2014-09-03 NOTE — Progress Notes (Signed)
Pt states that she is unable to take the Flagyl she was given, it makes her sick and she throws it back up.

## 2014-09-04 LAB — COMPREHENSIVE METABOLIC PANEL
ALBUMIN: 3.5 g/dL (ref 3.5–5.5)
ALT: 15 IU/L (ref 0–32)
AST: 17 IU/L (ref 0–40)
Albumin/Globulin Ratio: 1.1 (ref 1.1–2.5)
Alkaline Phosphatase: 144 IU/L — ABNORMAL HIGH (ref 39–117)
BUN / CREAT RATIO: 11 (ref 8–20)
BUN: 6 mg/dL (ref 6–20)
Bilirubin Total: 0.3 mg/dL (ref 0.0–1.2)
CHLORIDE: 101 mmol/L (ref 97–108)
CO2: 18 mmol/L (ref 18–29)
Calcium: 9.1 mg/dL (ref 8.7–10.2)
Creatinine, Ser: 0.56 mg/dL — ABNORMAL LOW (ref 0.57–1.00)
GFR calc Af Amer: 153 mL/min/{1.73_m2} (ref >59)
GFR, EST NON AFRICAN AMERICAN: 133 mL/min/{1.73_m2} (ref >59)
GLOBULIN, TOTAL: 3.2 g/dL (ref 1.5–4.5)
GLUCOSE: 77 mg/dL (ref 65–99)
POTASSIUM: 4 mmol/L (ref 3.5–5.2)
Sodium: 136 mmol/L (ref 134–144)
Total Protein: 6.7 g/dL (ref 6.0–8.5)

## 2014-09-04 LAB — CBC
Hematocrit: 37.2 % (ref 34.0–46.6)
Hemoglobin: 12 g/dL (ref 11.1–15.9)
MCH: 24.8 pg — ABNORMAL LOW (ref 26.6–33.0)
MCHC: 32.3 g/dL (ref 31.5–35.7)
MCV: 77 fL — AB (ref 79–97)
Platelets: 194 10*3/uL (ref 150–379)
RBC: 4.84 x10E6/uL (ref 3.77–5.28)
RDW: 19.7 % — ABNORMAL HIGH (ref 12.3–15.4)
WBC: 9.6 10*3/uL (ref 3.4–10.8)

## 2014-09-06 ENCOUNTER — Other Ambulatory Visit: Payer: Self-pay | Admitting: Obstetrics and Gynecology

## 2014-09-06 ENCOUNTER — Ambulatory Visit (INDEPENDENT_AMBULATORY_CARE_PROVIDER_SITE_OTHER): Payer: BLUE CROSS/BLUE SHIELD | Admitting: Obstetrics & Gynecology

## 2014-09-06 ENCOUNTER — Ambulatory Visit: Payer: BLUE CROSS/BLUE SHIELD

## 2014-09-06 VITALS — BP 152/110 | HR 84 | Wt 270.0 lb

## 2014-09-06 DIAGNOSIS — O10913 Unspecified pre-existing hypertension complicating pregnancy, third trimester: Secondary | ICD-10-CM

## 2014-09-06 DIAGNOSIS — Z1389 Encounter for screening for other disorder: Secondary | ICD-10-CM

## 2014-09-06 DIAGNOSIS — Z331 Pregnant state, incidental: Secondary | ICD-10-CM

## 2014-09-06 DIAGNOSIS — O09893 Supervision of other high risk pregnancies, third trimester: Secondary | ICD-10-CM

## 2014-09-06 LAB — POCT URINALYSIS DIPSTICK
Blood, UA: NEGATIVE
Glucose, UA: NEGATIVE
Ketones, UA: NEGATIVE
Nitrite, UA: NEGATIVE

## 2014-09-06 MED ORDER — LABETALOL HCL 200 MG PO TABS: 200.0000 mg | ORAL_TABLET | Freq: Two times a day (BID) | ORAL | Status: DC

## 2014-09-06 NOTE — Progress Notes (Signed)
Fetal Surveillance Testing today:  Reactive NST   High Risk Pregnancy Diagnosis(es):   Chronic Hypertension, pt BP had been borderline throughout her pregnancy on review but now certinly need treatment, she did not get her BP meds filled so I had our nurse Chrystal Pulliam take her to [facility] to get it filled  G1P0 [redacted]w[redacted]d Estimated Date of Delivery: 11/03/14  Blood pressure 152/110, pulse 84, weight 270 lb (122.471 kg), last menstrual period 01/27/2014.  Urinalysis: Negative   HPI: The patient is being seen today for ongoing management of Chronic hypertension. Today she reports no complaints   BP weight and urine results all reviewed and noted. Patient reports good fetal movement, denies any bleeding and no rupture of membranes symptoms or regular contractions.  Fundal Height:  36 Fetal Heart rate:  140 Edema:  trace  Patient is without complaints other than noted in her HPI. All questions were answered.  All lab and sonogram results have been reviewed. Comments: abnormal: BP   Assessment:  1.  Pregnancy at [redacted]w[redacted]d,  Estimated Date of Delivery: 11/03/14 :                          2.  Chronic hypertension, was borderline now need treatment                        3.    Medication(s) Plans:  Labetalol 200 BID  Treatment Plan:  Twice weekly surveillance  Follow up in monday weeks for appointment for high risk OB care, sonogram

## 2014-09-07 LAB — PROTEIN,TOTAL,URINE: Protein, Ur: 50.3 mg/dL

## 2014-09-10 ENCOUNTER — Other Ambulatory Visit: Payer: BLUE CROSS/BLUE SHIELD

## 2014-09-10 ENCOUNTER — Encounter: Payer: BLUE CROSS/BLUE SHIELD | Admitting: Obstetrics and Gynecology

## 2014-09-11 NOTE — Addendum Note (Signed)
Addended by: Criss Alvine on: 09/11/2014 11:42 AM   Modules accepted: Orders

## 2014-09-13 ENCOUNTER — Other Ambulatory Visit: Payer: BLUE CROSS/BLUE SHIELD

## 2014-09-13 ENCOUNTER — Encounter: Payer: BLUE CROSS/BLUE SHIELD | Admitting: Obstetrics and Gynecology

## 2014-09-18 ENCOUNTER — Ambulatory Visit (INDEPENDENT_AMBULATORY_CARE_PROVIDER_SITE_OTHER): Payer: BLUE CROSS/BLUE SHIELD | Admitting: Obstetrics and Gynecology

## 2014-09-18 ENCOUNTER — Encounter: Payer: Self-pay | Admitting: Obstetrics and Gynecology

## 2014-09-18 ENCOUNTER — Ambulatory Visit (INDEPENDENT_AMBULATORY_CARE_PROVIDER_SITE_OTHER): Payer: BLUE CROSS/BLUE SHIELD

## 2014-09-18 VITALS — BP 128/86 | HR 88 | Wt 269.0 lb

## 2014-09-18 DIAGNOSIS — Z331 Pregnant state, incidental: Secondary | ICD-10-CM

## 2014-09-18 DIAGNOSIS — A749 Chlamydial infection, unspecified: Secondary | ICD-10-CM

## 2014-09-18 DIAGNOSIS — O0933 Supervision of pregnancy with insufficient antenatal care, third trimester: Secondary | ICD-10-CM

## 2014-09-18 DIAGNOSIS — Z1389 Encounter for screening for other disorder: Secondary | ICD-10-CM

## 2014-09-18 DIAGNOSIS — O10913 Unspecified pre-existing hypertension complicating pregnancy, third trimester: Secondary | ICD-10-CM

## 2014-09-18 DIAGNOSIS — O09893 Supervision of other high risk pregnancies, third trimester: Secondary | ICD-10-CM

## 2014-09-18 DIAGNOSIS — A5901 Trichomonal vulvovaginitis: Secondary | ICD-10-CM

## 2014-09-18 LAB — POCT URINALYSIS DIPSTICK
GLUCOSE UA: NEGATIVE
Ketones, UA: NEGATIVE
LEUKOCYTES UA: NEGATIVE
Nitrite, UA: NEGATIVE
Protein, UA: 1
RBC UA: NEGATIVE

## 2014-09-18 NOTE — Progress Notes (Addendum)
[place] 33+3wks,efw 2190g 44.8%,cephalic,BPP 8/8,RI .68,.61,ant pl gr 2,bilat adnexa wnl,afi 11.5cm

## 2014-09-18 NOTE — Progress Notes (Signed)
Pt states that her throat feels like there is something caught in it and there is nothing in there. Pt states that she tried to take the Flagyl but the pills made her throw up. . Pt to use tinidazole High Risk Pregnancy Diagnosis(es):    Chronic htn preg 33+3 wk  Blood pressure 128/86, pulse 88, weight 269 lb (122.018 kg), last menstrual period 01/27/2014. HPI: G1P0 [redacted]w[redacted]d Estimated Date of Delivery: 11/03/14     No PIH sx     BP weight and urine results reviewed and notable for 1 + protein again. Patient reports    good fetal movement, denies any bleeding and no rupture of membranes symptoms or regular contractions .  Fundal Height:  34 Fetal Heart rate:  145 Edema:  neg Urinalysis: Positive for 1+ protein  .Assessment:[redacted]w[redacted]d,   Chtn,   Medication(s) Plans:  Continue labetalol  Treatment Plan:         Biweekly testing NST alternating with BPP Follow up:           0.5 weeks for  NST/Bpp All questions were answered.

## 2014-09-21 ENCOUNTER — Encounter: Payer: Self-pay | Admitting: Obstetrics & Gynecology

## 2014-09-21 ENCOUNTER — Ambulatory Visit (INDEPENDENT_AMBULATORY_CARE_PROVIDER_SITE_OTHER): Payer: BLUE CROSS/BLUE SHIELD | Admitting: Obstetrics & Gynecology

## 2014-09-21 VITALS — BP 130/90 | HR 84 | Wt 271.0 lb

## 2014-09-21 DIAGNOSIS — O10913 Unspecified pre-existing hypertension complicating pregnancy, third trimester: Secondary | ICD-10-CM | POA: Diagnosis not present

## 2014-09-21 DIAGNOSIS — Z331 Pregnant state, incidental: Secondary | ICD-10-CM

## 2014-09-21 DIAGNOSIS — Z1389 Encounter for screening for other disorder: Secondary | ICD-10-CM

## 2014-09-21 DIAGNOSIS — O10919 Unspecified pre-existing hypertension complicating pregnancy, unspecified trimester: Secondary | ICD-10-CM

## 2014-09-21 DIAGNOSIS — O09893 Supervision of other high risk pregnancies, third trimester: Secondary | ICD-10-CM

## 2014-09-21 LAB — POCT URINALYSIS DIPSTICK
Blood, UA: NEGATIVE
Glucose, UA: NEGATIVE
KETONES UA: NEGATIVE
LEUKOCYTES UA: NEGATIVE
Nitrite, UA: NEGATIVE
PROTEIN UA: 1

## 2014-09-21 NOTE — Addendum Note (Signed)
Addended by: Lazaro Arms on: 09/21/2014 12:04 PM   Modules accepted: Orders

## 2014-09-21 NOTE — Progress Notes (Signed)
Fetal Surveillance Testing today:  Reactive NST   High Risk Pregnancy Diagnosis(es):   Chronic Hypertension  G1P0 [redacted]w[redacted]d Estimated Date of Delivery: 11/03/14  Blood pressure 130/90, pulse 84, weight 271 lb (122.925 kg), last menstrual period 01/27/2014.  Urinalysis: Positive for 1+ protein   HPI: The patient is being seen today for ongoing management of chronic hypertension. Today she reports no complaints   BP weight and urine results all reviewed and noted. Patient reports good fetal movement, denies any bleeding and no rupture of membranes symptoms or regular contractions.  Fundal Height:  36 Fetal Heart rate:  140 Edema:  none  Patient is without complaints other than noted in her HPI. All questions were answered.  All lab and sonogram results have been reviewed. Comments:    Assessment:  1.  Pregnancy at [redacted]w[redacted]d,  Estimated Date of Delivery: 11/03/14 :                          2.  Chronic hypertension                        3.    Medication(s) Plans:  Labetalol 200 bid  Treatment Plan:  Twice weekly sonogram alt with sonogram  Follow up in NST weeks for appointment for high risk OB care, NST sonogram next friday

## 2014-09-21 NOTE — Progress Notes (Signed)
Pt has an appt with Dr. Despina Hidden today September 21, 2014 for NST and OB visit. Dr.Eure notified of +1 Protein at last visit and Dr. Emelda Fear recommendation.

## 2014-09-22 LAB — PROTEIN / CREATININE RATIO, URINE
Creatinine, Urine: 276.8 mg/dL
PROTEIN/CREAT RATIO: 192 mg/g{creat} (ref 0–200)
Protein, Ur: 53.1 mg/dL

## 2014-09-26 ENCOUNTER — Ambulatory Visit (INDEPENDENT_AMBULATORY_CARE_PROVIDER_SITE_OTHER): Payer: BLUE CROSS/BLUE SHIELD | Admitting: Obstetrics and Gynecology

## 2014-09-26 VITALS — BP 132/82 | HR 84 | Wt 272.0 lb

## 2014-09-26 DIAGNOSIS — O10912 Unspecified pre-existing hypertension complicating pregnancy, second trimester: Secondary | ICD-10-CM

## 2014-09-26 DIAGNOSIS — A499 Bacterial infection, unspecified: Secondary | ICD-10-CM

## 2014-09-26 DIAGNOSIS — B9689 Other specified bacterial agents as the cause of diseases classified elsewhere: Secondary | ICD-10-CM

## 2014-09-26 DIAGNOSIS — Z1389 Encounter for screening for other disorder: Secondary | ICD-10-CM

## 2014-09-26 DIAGNOSIS — Z331 Pregnant state, incidental: Secondary | ICD-10-CM

## 2014-09-26 DIAGNOSIS — O10919 Unspecified pre-existing hypertension complicating pregnancy, unspecified trimester: Secondary | ICD-10-CM

## 2014-09-26 DIAGNOSIS — N760 Acute vaginitis: Secondary | ICD-10-CM | POA: Diagnosis not present

## 2014-09-26 DIAGNOSIS — O1213 Gestational proteinuria, third trimester: Secondary | ICD-10-CM

## 2014-09-26 LAB — US OB FOLLOW UP

## 2014-09-26 LAB — POCT URINALYSIS DIPSTICK
Blood, UA: NEGATIVE
GLUCOSE UA: NEGATIVE
KETONES UA: NEGATIVE
LEUKOCYTES UA: NEGATIVE
Nitrite, UA: NEGATIVE
Protein, UA: 2

## 2014-09-26 MED ORDER — METRONIDAZOLE 500 MG PO TABS: 500.0000 mg | ORAL_TABLET | Freq: Two times a day (BID) | ORAL | Status: AC

## 2014-09-26 NOTE — Progress Notes (Signed)
High Risk Pregnancy Diagnosis(es):   CHTN noted at 18 wk on labetalol 200 bid.   Blood pressure 132/82, pulse 84, weight 272 lb (123.378 kg), last menstrual period 01/27/2014. HPI: G1P0 [redacted]w[redacted]d Estimated Date of Delivery: 11/03/14     Having a vag discharge, was tx'd for trich in early pregnancy and notes d/c is back.      BP weight and urine results reviewed and notable for good control.. Patient reports    good fetal movement, denies any bleeding and no rupture of membranes symptoms or regular contractions .  Fundal Height:   Fetal Heart rate:  150 Edema:  Neg, Reflexes 0-1+,NO Clonus. Urinalysis: Positive for 2+ protein  .Assessment:[redacted]w[redacted]d,   Chtn,                   Rule out pre-E Medication(s) Plans:  Continue labetalol  Treatment Plan:    Cbc                2               Cmet                                 24hr TP                                 Pr/Cr Ratio Follow up:           2days  for  results All questions were answered.

## 2014-09-27 LAB — COMPREHENSIVE METABOLIC PANEL
ALT: 14 IU/L (ref 0–32)
AST: 11 IU/L (ref 0–40)
Albumin/Globulin Ratio: 1.1 (ref 1.1–2.5)
Albumin: 3.3 g/dL — ABNORMAL LOW (ref 3.5–5.5)
Alkaline Phosphatase: 153 IU/L — ABNORMAL HIGH (ref 39–117)
BUN/Creatinine Ratio: 7 — ABNORMAL LOW (ref 8–20)
BUN: 5 mg/dL — ABNORMAL LOW (ref 6–20)
Bilirubin Total: <0.2 mg/dL (ref 0.0–1.2)
CALCIUM: 8.8 mg/dL (ref 8.7–10.2)
CO2: 18 mmol/L (ref 18–29)
CREATININE: 0.7 mg/dL (ref 0.57–1.00)
Chloride: 105 mmol/L (ref 97–108)
GFR calc Af Amer: 142 mL/min/{1.73_m2} (ref >59)
GFR, EST NON AFRICAN AMERICAN: 123 mL/min/{1.73_m2} (ref >59)
GLOBULIN, TOTAL: 3 g/dL (ref 1.5–4.5)
Glucose: 95 mg/dL (ref 65–99)
Potassium: 3.7 mmol/L (ref 3.5–5.2)
SODIUM: 137 mmol/L (ref 134–144)
Total Protein: 6.3 g/dL (ref 6.0–8.5)

## 2014-09-27 LAB — CBC
HEMATOCRIT: 34.2 % (ref 34.0–46.6)
Hemoglobin: 11 g/dL — ABNORMAL LOW (ref 11.1–15.9)
MCH: 24.4 pg — ABNORMAL LOW (ref 26.6–33.0)
MCHC: 32.2 g/dL (ref 31.5–35.7)
MCV: 76 fL — ABNORMAL LOW (ref 79–97)
Platelets: 200 10*3/uL (ref 150–379)
RBC: 4.5 x10E6/uL (ref 3.77–5.28)
RDW: 18 % — AB (ref 12.3–15.4)
WBC: 9.4 10*3/uL (ref 3.4–10.8)

## 2014-09-27 LAB — PROTEIN / CREATININE RATIO, URINE
Creatinine, Urine: 533.2 mg/dL
PROTEIN/CREAT RATIO: 233 mg/g{creat} — AB (ref 0–200)
Protein, Ur: 124.1 mg/dL

## 2014-10-02 ENCOUNTER — Encounter: Payer: BLUE CROSS/BLUE SHIELD | Admitting: Obstetrics & Gynecology

## 2014-10-05 ENCOUNTER — Other Ambulatory Visit (INDEPENDENT_AMBULATORY_CARE_PROVIDER_SITE_OTHER): Payer: BLUE CROSS/BLUE SHIELD

## 2014-10-05 ENCOUNTER — Other Ambulatory Visit: Payer: Self-pay | Admitting: Obstetrics and Gynecology

## 2014-10-05 ENCOUNTER — Ambulatory Visit (INDEPENDENT_AMBULATORY_CARE_PROVIDER_SITE_OTHER): Payer: BLUE CROSS/BLUE SHIELD | Admitting: Obstetrics & Gynecology

## 2014-10-05 ENCOUNTER — Other Ambulatory Visit: Payer: Self-pay | Admitting: Obstetrics & Gynecology

## 2014-10-05 ENCOUNTER — Encounter: Payer: Self-pay | Admitting: Obstetrics & Gynecology

## 2014-10-05 ENCOUNTER — Inpatient Hospital Stay (HOSPITAL_COMMUNITY)
Admission: AD | Admit: 2014-10-05 | Discharge: 2014-10-13 | DRG: 774 | Disposition: A | Discharge status: Home / Self Care | Payer: Medicaid Other | Source: Ambulatory Visit | Attending: Obstetrics & Gynecology | Admitting: Obstetrics & Gynecology

## 2014-10-05 VITALS — BP 140/102 | HR 100 | Wt 275.0 lb

## 2014-10-05 DIAGNOSIS — A749 Chlamydial infection, unspecified: Secondary | ICD-10-CM

## 2014-10-05 DIAGNOSIS — O1213 Gestational proteinuria, third trimester: Secondary | ICD-10-CM

## 2014-10-05 DIAGNOSIS — O113 Pre-existing hypertension with pre-eclampsia, third trimester: Secondary | ICD-10-CM | POA: Diagnosis not present

## 2014-10-05 DIAGNOSIS — O26613 Liver and biliary tract disorders in pregnancy, third trimester: Secondary | ICD-10-CM

## 2014-10-05 DIAGNOSIS — O10919 Unspecified pre-existing hypertension complicating pregnancy, unspecified trimester: Secondary | ICD-10-CM

## 2014-10-05 DIAGNOSIS — O163 Unspecified maternal hypertension, third trimester: Secondary | ICD-10-CM

## 2014-10-05 DIAGNOSIS — O1092 Unspecified pre-existing hypertension complicating childbirth: Secondary | ICD-10-CM | POA: Diagnosis present

## 2014-10-05 DIAGNOSIS — O9832 Other infections with a predominantly sexual mode of transmission complicating childbirth: Secondary | ICD-10-CM | POA: Diagnosis not present

## 2014-10-05 DIAGNOSIS — Z23 Encounter for immunization: Secondary | ICD-10-CM

## 2014-10-05 DIAGNOSIS — O10913 Unspecified pre-existing hypertension complicating pregnancy, third trimester: Secondary | ICD-10-CM

## 2014-10-05 DIAGNOSIS — O0930 Supervision of pregnancy with insufficient antenatal care, unspecified trimester: Secondary | ICD-10-CM | POA: Diagnosis not present

## 2014-10-05 DIAGNOSIS — O1413 Severe pre-eclampsia, third trimester: Secondary | ICD-10-CM | POA: Diagnosis present

## 2014-10-05 DIAGNOSIS — O9989 Other specified diseases and conditions complicating pregnancy, childbirth and the puerperium: Secondary | ICD-10-CM | POA: Diagnosis present

## 2014-10-05 DIAGNOSIS — K831 Obstruction of bile duct: Secondary | ICD-10-CM

## 2014-10-05 DIAGNOSIS — Z331 Pregnant state, incidental: Secondary | ICD-10-CM

## 2014-10-05 DIAGNOSIS — O09893 Supervision of other high risk pregnancies, third trimester: Secondary | ICD-10-CM

## 2014-10-05 DIAGNOSIS — Z3A35 35 weeks gestation of pregnancy: Secondary | ICD-10-CM | POA: Diagnosis present

## 2014-10-05 DIAGNOSIS — Z79899 Other long term (current) drug therapy: Secondary | ICD-10-CM

## 2014-10-05 DIAGNOSIS — A568 Sexually transmitted chlamydial infection of other sites: Secondary | ICD-10-CM | POA: Diagnosis present

## 2014-10-05 DIAGNOSIS — A5901 Trichomonal vulvovaginitis: Secondary | ICD-10-CM

## 2014-10-05 DIAGNOSIS — O9952 Diseases of the respiratory system complicating childbirth: Secondary | ICD-10-CM | POA: Diagnosis present

## 2014-10-05 DIAGNOSIS — J45909 Unspecified asthma, uncomplicated: Secondary | ICD-10-CM | POA: Diagnosis present

## 2014-10-05 DIAGNOSIS — Z1389 Encounter for screening for other disorder: Secondary | ICD-10-CM

## 2014-10-05 DIAGNOSIS — R87610 Atypical squamous cells of undetermined significance on cytologic smear of cervix (ASC-US): Secondary | ICD-10-CM | POA: Diagnosis present

## 2014-10-05 DIAGNOSIS — O0933 Supervision of pregnancy with insufficient antenatal care, third trimester: Secondary | ICD-10-CM

## 2014-10-05 DIAGNOSIS — Z8249 Family history of ischemic heart disease and other diseases of the circulatory system: Secondary | ICD-10-CM

## 2014-10-05 DIAGNOSIS — O26643 Intrahepatic cholestasis of pregnancy, third trimester: Secondary | ICD-10-CM

## 2014-10-05 DIAGNOSIS — R87810 Cervical high risk human papillomavirus (HPV) DNA test positive: Secondary | ICD-10-CM | POA: Diagnosis present

## 2014-10-05 LAB — URINALYSIS, ROUTINE W REFLEX MICROSCOPIC
GLUCOSE, UA: NEGATIVE mg/dL
Ketones, ur: 15 mg/dL — AB
Nitrite: NEGATIVE
PROTEIN: 100 mg/dL — AB
Specific Gravity, Urine: >1.03 — ABNORMAL HIGH (ref 1.005–1.030)
Urobilinogen, UA: 0.2 mg/dL (ref 0.0–1.0)
pH: 5.5 (ref 5.0–8.0)

## 2014-10-05 LAB — POCT URINALYSIS DIPSTICK
Glucose, UA: NEGATIVE
Ketones, UA: NEGATIVE
Nitrite, UA: NEGATIVE

## 2014-10-05 LAB — COMPREHENSIVE METABOLIC PANEL
ALBUMIN: 2.8 g/dL — AB (ref 3.5–5.0)
ALK PHOS: 160 U/L — AB (ref 38–126)
ALT: 16 U/L (ref 14–54)
ANION GAP: 10 (ref 5–15)
AST: 20 U/L (ref 15–41)
BUN: 7 mg/dL (ref 6–20)
CHLORIDE: 108 mmol/L (ref 101–111)
CO2: 19 mmol/L — AB (ref 22–32)
Calcium: 8.8 mg/dL — ABNORMAL LOW (ref 8.9–10.3)
Creatinine, Ser: 0.67 mg/dL (ref 0.44–1.00)
GFR calc Af Amer: >60 mL/min (ref >60)
GFR calc non Af Amer: >60 mL/min (ref >60)
GLUCOSE: 69 mg/dL (ref 65–99)
POTASSIUM: 3.9 mmol/L (ref 3.5–5.1)
SODIUM: 137 mmol/L (ref 135–145)
Total Bilirubin: 0.6 mg/dL (ref 0.3–1.2)
Total Protein: 6.5 g/dL (ref 6.5–8.1)

## 2014-10-05 LAB — CBC
HCT: 33.5 % — ABNORMAL LOW (ref 36.0–46.0)
HEMOGLOBIN: 11.3 g/dL — AB (ref 12.0–15.0)
MCH: 25.3 pg — AB (ref 26.0–34.0)
MCHC: 33.7 g/dL (ref 30.0–36.0)
MCV: 75.1 fL — ABNORMAL LOW (ref 78.0–100.0)
Platelets: 183 10*3/uL (ref 150–400)
RBC: 4.46 MIL/uL (ref 3.87–5.11)
RDW: 16.9 % — ABNORMAL HIGH (ref 11.5–15.5)
WBC: 8.9 10*3/uL (ref 4.0–10.5)

## 2014-10-05 LAB — URINE MICROSCOPIC-ADD ON

## 2014-10-05 LAB — PROTEIN / CREATININE RATIO, URINE
CREATININE, URINE: 492 mg/dL
PROTEIN CREATININE RATIO: 0.36 mg/mg{creat} — AB (ref 0.00–0.15)
TOTAL PROTEIN, URINE: 179 mg/dL

## 2014-10-05 MED ORDER — BETAMETHASONE SOD PHOS & ACET 6 (3-3) MG/ML IJ SUSP
12.0000 mg | Freq: Once | INTRAMUSCULAR | Status: AC
  Administered 2014-10-05: 12 mg via INTRAMUSCULAR
  Filled 2014-10-05: qty 2

## 2014-10-05 MED ORDER — HYDRALAZINE HCL 20 MG/ML IJ SOLN
10.0000 mg | Freq: Once | INTRAMUSCULAR | Status: DC | PRN
  Filled 2014-10-05: qty 1

## 2014-10-05 MED ORDER — MAGNESIUM SULFATE BOLUS VIA INFUSION
4.0000 g | Freq: Once | INTRAVENOUS | Status: AC
  Administered 2014-10-06: 4 g via INTRAVENOUS
  Filled 2014-10-05: qty 500

## 2014-10-05 MED ORDER — LABETALOL HCL 5 MG/ML IV SOLN
20.0000 mg | INTRAVENOUS | Status: AC | PRN
  Administered 2014-10-05: 40 mg via INTRAVENOUS
  Administered 2014-10-05: 20 mg via INTRAVENOUS
  Administered 2014-10-05: 80 mg via INTRAVENOUS
  Filled 2014-10-05: qty 16
  Filled 2014-10-05: qty 8
  Filled 2014-10-05: qty 4

## 2014-10-05 MED ORDER — MAGNESIUM SULFATE 50 % IJ SOLN
2.0000 g/h | INTRAVENOUS | Status: DC
  Administered 2014-10-06 – 2014-10-11 (×6): 2 g/h via INTRAVENOUS
  Filled 2014-10-05 (×7): qty 80

## 2014-10-05 MED ORDER — LABETALOL HCL 100 MG PO TABS
200.0000 mg | ORAL_TABLET | Freq: Two times a day (BID) | ORAL | Status: DC
Start: 2014-10-05 — End: 2014-10-06
  Administered 2014-10-06 (×2): 200 mg via ORAL
  Filled 2014-10-05 (×2): qty 2

## 2014-10-05 MED ORDER — LABETALOL HCL 5 MG/ML IV SOLN
20.0000 mg | INTRAVENOUS | Status: AC | PRN
Start: 2014-10-05 — End: 2014-10-06
  Administered 2014-10-06: 20 mg via INTRAVENOUS
  Administered 2014-10-06: 80 mg via INTRAVENOUS
  Administered 2014-10-06: 40 mg via INTRAVENOUS
  Filled 2014-10-05: qty 8
  Filled 2014-10-05: qty 4
  Filled 2014-10-05: qty 16

## 2014-10-05 MED ORDER — HYDRALAZINE HCL 20 MG/ML IJ SOLN
10.0000 mg | Freq: Once | INTRAMUSCULAR | Status: AC | PRN
  Administered 2014-10-06: 10 mg via INTRAVENOUS

## 2014-10-05 NOTE — MAU Provider Note (Signed)
Obstetric Attending MAU Note  Chief Complaint:  Hypertension     First Provider Initiated Contact with Patient 10/05/14 2152     HPI: Heather Huff is a 24 y.o. G1P0 at [redacted]w[redacted]d who presents to maternity admissions reporting elevated pressures at office visit today accompanied with 3+ proteinuria. She denies headache , scotoma, ruq pain.. Denies contractions, leakage of fluid or vaginal bleeding. Good fetal movement.  Since arrival here  She has had severe range bp's , and PIH labs are normal for platelets, LFT's, but Pr/Cr ratio elevated at 0.36, and 100 mg/dl on urinalysis. She has been on labetalol at 200 mg bid, with satisfactory biweekly testing, but with severe bp range, will admit for Mag sulfate, betamethasone, and IOL for chronic HTN with superimposed preeclampsia with severe features.  Pregnancy Course: Receives care at [facility]  Patient Active Problem List   Diagnosis Date Noted  . Chronic hypertension during pregnancy, antepartum 08/29/2014  . Chlamydia 05/30/2014  . ASCUS with positive high risk HPV 05/30/2014  . Late prenatal care 05/22/2014  . Trichomonal vaginitis 05/22/2014  . Asthma   . Supervision of other high-risk pregnancy 04/17/2014  . Nausea and vomiting during pregnancy 04/17/2014    Past Medical History  Diagnosis Date  . Allergy     seasonal  . Pregnant 04/17/2014  . Nausea and vomiting during pregnancy 04/17/2014  . Asthma     uses inhaler prn    OB History  Gravida Para Term Preterm AB SAB TAB Ectopic Multiple Living  1             # Outcome Date GA Lbr Len/2nd Weight Sex Delivery Anes PTL Lv  1 Current               No past surgical history on file.  Family History: Family History  Problem Relation Age of Onset  . Hypertension Mother   . Thyroid disease Mother   . Asthma Sister   . Allergies Brother   . Arthritis Maternal Grandmother   . Hypertension Maternal Grandmother     Social History: Social History  Substance Use Topics  .  Smoking status: Never Smoker   . Smokeless tobacco: Never Used  . Alcohol Use: No     Comment: not now    Allergies: No Known Allergies  Prescriptions prior to admission  Medication Sig Dispense Refill Last Dose  . albuterol (PROVENTIL HFA;VENTOLIN HFA) 108 (90 BASE) MCG/ACT inhaler Inhale 1 puff into the lungs every 6 (six) hours as needed for wheezing or shortness of breath.    10/05/2014 at 1500  . labetalol (NORMODYNE) 200 MG tablet Take 1 tablet (200 mg total) by mouth 2 (two) times daily. 60 tablet 3 10/05/2014 at 0850  . Prenatal Vit-Iron Carbonyl-FA (PRENATAL PLUS IRON) 29-1 MG TABS Take 1 daily (Patient taking differently: Take 1 tablet by mouth daily. ) 30 tablet 11 10/04/2014 at Unknown time    ROS: Pertinent findings in history of present illness.  Physical Exam  Blood pressure 167/117, pulse 74, temperature 98.3 F (36.8 C), temperature source Oral, resp. rate 16, height 5\' 5"  (1.651 m), weight 123.378 kg (272 lb), last menstrual period 01/27/2014, SpO2 98 %.  Temp:  [98.3 F (36.8 C)] 98.3 F (36.8 C) (08/26 2056) Pulse Rate:  [70-100] 74 (08/26 2247) Resp:  [16] 16 (08/26 2056) BP: (140-183)/(96-121) 167/117 mmHg (08/26 2247) SpO2:  [98 %] 98 % (08/26 2056) Weight:  [123.378 kg (272 lb)-124.739 kg (275 lb)] 123.378  kg (272 lb) (08/26 2049)  CONSTITUTIONAL: Well-developed, well-nourished female in no acute distress.  HENT:  Normocephalic, atraumatic, External right and left ear normal. Oropharynx is clear and moist EYES: Conjunctivae and EOM are normal. Pupils are equal, round, and reactive to light. No scleral icterus.  NECK: Normal range of motion, supple, no masses SKIN: Skin is warm and dry. No rash noted. Not diaphoretic. No erythema. No pallor. NEUROLGIC: Alert and oriented to person, place, and time. Normal reflexes, muscle tone coordination. No cranial nerve deficit noted. PSYCHIATRIC: Normal mood and affect. Normal behavior. Normal judgment and thought  content. CARDIOVASCULAR: Normal heart rate noted, regular rhythm RESPIRATORY: Effort and breath sounds normal, no problems with respiration noted ABDOMEN: Soft, nontender, nondistended, gravid appropriate for gestational age MUSCULOSKELETAL: Normal range of motion. No edema and no tenderness. 2+ distal pulses.  SPECULUM EXAM: NEFG, physiologic discharge, no blood, cervix clean Dilation: Closed Effacement (%): Thick Cervical Position: midposition Exam by:: Emelda Fear, MD  FHT:  Baseline 140 , moderate variability, accelerations present, no decelerations Contractions: none mins   Labs: Results for orders placed or performed during the hospital encounter of 10/05/14 (from the past 24 hour(s))  Urinalysis, Routine w reflex microscopic (not at [facility])     Status: Abnormal   Collection Time: 10/05/14  9:00 PM  Result Value Ref Range   Color, Urine YELLOW YELLOW   APPearance HAZY (A) CLEAR   Specific Gravity, Urine >1.030 (H) 1.005 - 1.030   pH 5.5 5.0 - 8.0   Glucose, UA NEGATIVE NEGATIVE mg/dL   Hgb urine dipstick TRACE (A) NEGATIVE   Bilirubin Urine SMALL (A) NEGATIVE   Ketones, ur 15 (A) NEGATIVE mg/dL   Protein, ur 409 (A) NEGATIVE mg/dL   Urobilinogen, UA 0.2 0.0 - 1.0 mg/dL   Nitrite NEGATIVE NEGATIVE   Leukocytes, UA MODERATE (A) NEGATIVE  Urine microscopic-add on     Status: Abnormal   Collection Time: 10/05/14  9:00 PM  Result Value Ref Range   Squamous Epithelial / LPF MANY (A) RARE   WBC, UA 11-20 <3 WBC/hpf   RBC / HPF 0-2 <3 RBC/hpf   Bacteria, UA MANY (A) RARE   Urine-Other MUCOUS PRESENT   Protein / creatinine ratio, urine     Status: Abnormal   Collection Time: 10/05/14  9:00 PM  Result Value Ref Range   Creatinine, Urine 492.00 mg/dL   Total Protein, Urine 179 mg/dL   Protein Creatinine Ratio 0.36 (H) 0.00 - 0.15 mg/mg[Cre]  CBC     Status: Abnormal   Collection Time: 10/05/14 10:00 PM  Result Value Ref Range   WBC 8.9 4.0 - 10.5 K/uL   RBC 4.46 3.87 - 5.11  MIL/uL   Hemoglobin 11.3 (L) 12.0 - 15.0 g/dL   HCT 81.1 (L) 91.4 - 78.2 %   MCV 75.1 (L) 78.0 - 100.0 fL   MCH 25.3 (L) 26.0 - 34.0 pg   MCHC 33.7 30.0 - 36.0 g/dL   RDW 95.6 (H) 21.3 - 08.6 %   Platelets 183 150 - 400 K/uL  Comprehensive metabolic panel     Status: Abnormal   Collection Time: 10/05/14 10:00 PM  Result Value Ref Range   Sodium 137 135 - 145 mmol/L   Potassium 3.9 3.5 - 5.1 mmol/L   Chloride 108 101 - 111 mmol/L   CO2 19 (L) 22 - 32 mmol/L   Glucose, Bld 69 65 - 99 mg/dL   BUN 7 6 - 20 mg/dL   Creatinine, Ser 5.78  0.44 - 1.00 mg/dL   Calcium 8.8 (L) 8.9 - 10.3 mg/dL   Total Protein 6.5 6.5 - 8.1 g/dL   Albumin 2.8 (L) 3.5 - 5.0 g/dL   AST 20 15 - 41 U/L   ALT 16 14 - 54 U/L   Alkaline Phosphatase 160 (H) 38 - 126 U/L   Total Bilirubin 0.6 0.3 - 1.2 mg/dL   GFR calc non Af Amer >60 >60 mL/min   GFR calc Af Amer >60 >60 mL/min   Anion gap 10 5 - 15    Imaging:  US Ob Follow Up  10/05/2014   FOLLOW UP SONOGRAM   Heather Huff is in the office for a follow up sonogram for EFW,BPP,cord  dopplers.  She is a 24 y.o. year old G1P0 with Estimated Date of Delivery: 11/03/14 by  early ultrasound now at  [redacted]w[redacted]d weeks gestation. Thus far the pregnancy has  been complicated by CHTN,late pnc.Marland Kitchen  GESTATION: SINGLETON  PRESENTATION: cephalic  FETAL ACTIVITY:          Heart rate         135          The fetus is active.  AMNIOTIC FLUID: The amniotic fluid volume is  normal, 14.6 cm.  PLACENTA LOCALIZATION:  anterior GRADE 2  CERVIX: Limited view  ADNEXA: wnl   GESTATIONAL AGE AND  BIOMETRICS:  Gestational criteria: Estimated Date of Delivery: 11/03/14 by early  ultrasound now at [redacted]w[redacted]d  Previous Scans:6              BIPARIETAL DIAMETER           8.74 cm         35+2 weeks  HEAD CIRCUMFERENCE           32.32 cm         36+4 weeks  ABDOMINAL CIRCUMFERENCE           30.70 cm         34+5 weeks  FEMUR LENGTH           6.95 cm         35+5 weeks                                                            AVERAGE EGA(BY THIS SCAN):   35+4 weeks                                                ESTIMATED FETAL WEIGHT:        2623  grams, 34.6 %   BIOPHYSCIAL PROFILE:  COMMENTS GROSS BODY MOVEMENT                 2   TONE                2   RESPIRATIONS                2   AMNIOTIC FLUID                2                                                            SCORE:  8/8 (Note: NST was not performed as part of this antepartum testing)  DOPPLER FLOW STUDIES: UMBILICAL ARTERY RI RATIOS:   0.62, 0.59     ANATOMICAL SURVEY                                                                             COMMENTS CEREBRAL VENTRICLES     CHOROID PLEXUS     CEREBELLUM     CISTERNA MAGNA     NUCHAL REGION     ORBITS     NASAL BONE     NOSE/LIP     FACIAL PROFILE yes normal   4 CHAMBERED HEART     OUTFLOW TRACTS      DIAPHRAGM yes normal   STOMACH yes normal   RENAL REGION yes normal   BLADDER yes normal   CORD INSERTION     3 VESSEL CORD     SPINE     ARMS/HANDS     LEGS/FEET     GENITALIA   Pt doesn't want to know        SUSPECTED ABNORMALITIES:  no  QUALITY OF SCAN: Limited view   TECHNICIAN COMMENTS:  [place] 35+6wks,cephalic,afi 14.6cm,ant pl gr 2 ,bilat adnexa wnl,BPP 8/8,RI  .62,.59,efw 2623g 34.6%        A copy of this report including all images has been saved and backed up to  a second source for retrieval if needed. All measures and details of the  anatomical scan, placentation, fluid volume and pelvic anatomy are  contained in that report.  Heather Huff 10/05/2014 11:27 AM      US Ob Follow Up  09/26/2014   FOLLOW UP SONOGRAM   Heather Huff is in the office for a follow up sonogram for BPP,EFW, and  cord doppler.  She is a 24 y.o. year old G1P0 with Estimated Date of Delivery: 11/03/14 by  early ultrasound now at  [redacted]w[redacted]d weeks gestation. Thus far the pregnancy has  been complicated by [facility] and late [facility].Marland Kitchen  GESTATION:  SINGLETON  PRESENTATION: cephalic  FETAL ACTIVITY:          Heart rate         151bpm          The fetus is active.  AMNIOTIC FLUID: The amniotic fluid volume is  normal, 11.5 cm.  PLACENTA LOCALIZATION:  anterior GRADE 2  CERVIX:  Limited view  ADNEXA: The ovaries are normal.   GESTATIONAL AGE AND  BIOMETRICS:  Gestational criteria: Estimated Date of Delivery: 11/03/14 by LMP now at  [redacted]w[redacted]d  Previous Scans:5 BIOPHYSCIAL PROFILE:                                                                                                       COMMENTS GROSS BODY MOVEMENT                 2   TONE                2   RESPIRATIONS                2   AMNIOTIC FLUID                2                                                            SCORE:  8/8 (Note: NST was not performed as part of this antepartum testing)  DOPPLER FLOW STUDIES: UMBILICAL ARTERY RI RATIOS:   0.68, 0.61              BIPARIETAL DIAMETER           8.29 cm         33+2 weeks  HEAD CIRCUMFERENCE           30.78 cm         34+2 weeks  ABDOMINAL CIRCUMFERENCE           29 cm         33 weeks  FEMUR LENGTH           6.49 cm         33+3 weeks                                                           AVERAGE EGA(BY THIS SCAN):   33+4 weeks                                                 ESTIMATED FETAL WEIGHT:        2190  grams, 44.8 % ANATOMICAL SURVEY  COMMENTS CEREBRAL VENTRICLES yes normal   CHOROID PLEXUS yes normal   CEREBELLUM     CISTERNA MAGNA     NUCHAL REGION     ORBITS     NASAL BONE     NOSE/LIP     FACIAL PROFILE yes normal   4 CHAMBERED HEART     OUTFLOW TRACTS     DIAPHRAGM yes normal   STOMACH yes normal   RENAL REGION yes normal   BLADDER yes normal   CORD INSERTION     3 VESSEL CORD     SPINE     ARMS/HANDS     LEGS/FEET     GENITALIA   Pt doesn't want to know         SUSPECTED ABNORMALITIES:  no  QUALITY OF SCAN: Limited view because of pt body habitus  TECHNICIAN COMMENTS:    [place] 33+3wks,efw 2190g 44.8%,cephalic,BPP 8/8,RI .68,.61,ant pl gr 2,bilat  adnexa wnl,afi 11.5cm   A copy of this report including all images has been saved and backed up to  a second source for retrieval if needed. All measures and details of the  anatomical scan, placentation, fluid volume and pelvic anatomy are  contained in that report.  Heather Huff 09/18/2014 2:30 PM  Clinical Impression and recommendations:  I have reviewed the sonogram results above.  Combined with the patient's current clinical course, below are my  impressions and any appropriate recommendations for management based on  the sonographic findings:  1. Symmetric fetal growth at 44%ile, normal for gestational age. 2 specifically reassuring fetal assessment with good BPP, fluid and  excellent Doppler flow 3 continue biweekly testing. Growth scan in 3 wk.  Heather Huff    US Fetal Bpp Wo Non Stress  10/05/2014   FOLLOW UP SONOGRAM   Heather Huff is in the office for a follow up sonogram for EFW,BPP,cord  dopplers.  She is a 24 y.o. year old G1P0 with Estimated Date of Delivery: 11/03/14 by  early ultrasound now at  [redacted]w[redacted]d weeks gestation. Thus far the pregnancy has  been complicated by CHTN,late pnc.Marland Kitchen  GESTATION: SINGLETON  PRESENTATION: cephalic  FETAL ACTIVITY:          Heart rate         135          The fetus is active.  AMNIOTIC FLUID: The amniotic fluid volume is  normal, 14.6 cm.  PLACENTA LOCALIZATION:  anterior GRADE 2  CERVIX: Limited view  ADNEXA: wnl   GESTATIONAL AGE AND  BIOMETRICS:  Gestational criteria: Estimated Date of Delivery: 11/03/14 by early  ultrasound now at [redacted]w[redacted]d  Previous Scans:6              BIPARIETAL DIAMETER           8.74 cm         35+2 weeks  HEAD CIRCUMFERENCE           32.32 cm         36+4 weeks  ABDOMINAL CIRCUMFERENCE           30.70 cm         34+5 weeks  FEMUR LENGTH           6.95 cm         35+5 weeks  AVERAGE EGA(BY THIS SCAN):   35+4 weeks                                                 ESTIMATED FETAL WEIGHT:        2623  grams, 34.6 %   BIOPHYSCIAL PROFILE:                                                                                                       COMMENTS GROSS BODY MOVEMENT                 2   TONE                2   RESPIRATIONS                2   AMNIOTIC FLUID                2                                                            SCORE:  8/8 (Note: NST was not performed as part of this antepartum testing)  DOPPLER FLOW STUDIES: UMBILICAL ARTERY RI RATIOS:   0.62, 0.59     ANATOMICAL SURVEY                                                                             COMMENTS CEREBRAL VENTRICLES     CHOROID PLEXUS     CEREBELLUM     CISTERNA MAGNA     NUCHAL REGION     ORBITS     NASAL BONE     NOSE/LIP     FACIAL PROFILE yes normal   4 CHAMBERED HEART     OUTFLOW TRACTS      DIAPHRAGM yes normal   STOMACH yes normal   RENAL REGION yes normal   BLADDER yes normal   CORD INSERTION     3 VESSEL CORD     SPINE     ARMS/HANDS     LEGS/FEET     GENITALIA   Pt doesn't want to know        SUSPECTED ABNORMALITIES:  no  QUALITY OF SCAN: Limited view   TECHNICIAN COMMENTS:  [place] 35+6wks,cephalic,afi 14.6cm,ant pl gr 2 ,bilat adnexa wnl,BPP 8/8,RI  .62,.59,efw 2623g 34.6%        A copy of this report including all images has been saved  and backed up to  a second source for retrieval if needed. All measures and details of the  anatomical scan, placentation, fluid volume and pelvic anatomy are  contained in that report.  Heather Huff 10/05/2014 11:27 AM      US Fetal Bpp W/o Non Stress  09/26/2014   FOLLOW UP SONOGRAM   Heather Huff is in the office for a follow up sonogram for BPP,EFW, and  cord doppler.  She is a 24 y.o. year old G1P0 with Estimated Date of Delivery: 11/03/14 by  early ultrasound now at  [redacted]w[redacted]d weeks gestation. Thus far the pregnancy has  been complicated by [facility] and late [facility].Marland Kitchen  GESTATION: SINGLETON  PRESENTATION: cephalic  FETAL  ACTIVITY:          Heart rate         151bpm          The fetus is active.  AMNIOTIC FLUID: The amniotic fluid volume is  normal, 11.5 cm.  PLACENTA LOCALIZATION:  anterior GRADE 2  CERVIX: Limited view  ADNEXA: The ovaries are normal.   GESTATIONAL AGE AND  BIOMETRICS:  Gestational criteria: Estimated Date of Delivery: 11/03/14 by LMP now at  [redacted]w[redacted]d  Previous Scans:5 BIOPHYSCIAL PROFILE:                                                                                                       COMMENTS GROSS BODY MOVEMENT                 2   TONE                2   RESPIRATIONS                2   AMNIOTIC FLUID                2                                                            SCORE:  8/8 (Note: NST was not performed as part of this antepartum testing)  DOPPLER FLOW STUDIES: UMBILICAL ARTERY RI RATIOS:   0.68, 0.61              BIPARIETAL DIAMETER           8.29 cm         33+2 weeks  HEAD CIRCUMFERENCE           30.78 cm         34+2 weeks  ABDOMINAL CIRCUMFERENCE           29 cm         33 weeks  FEMUR LENGTH           6.49 cm         33+3 weeks  AVERAGE EGA(BY THIS SCAN):   33+4 weeks                                                 ESTIMATED FETAL WEIGHT:        2190  grams, 44.8 % ANATOMICAL SURVEY                                                                             COMMENTS CEREBRAL VENTRICLES yes normal   CHOROID PLEXUS yes normal   CEREBELLUM     CISTERNA MAGNA     NUCHAL REGION     ORBITS     NASAL BONE     NOSE/LIP     FACIAL PROFILE yes normal   4 CHAMBERED HEART     OUTFLOW TRACTS     DIAPHRAGM yes normal   STOMACH yes normal   RENAL REGION yes normal   BLADDER yes normal   CORD INSERTION     3 VESSEL CORD     SPINE     ARMS/HANDS     LEGS/FEET     GENITALIA   Pt doesn't want to know         SUSPECTED ABNORMALITIES:  no  QUALITY OF SCAN: Limited view because of pt body habitus  TECHNICIAN COMMENTS:   [place] 33+3wks,efw 2190g 44.8%,cephalic,BPP  8/8,RI .68,.61,ant pl gr 2,bilat  adnexa wnl,afi 11.5cm   A copy of this report including all images has been saved and backed up to  a second source for retrieval if needed. All measures and details of the  anatomical scan, placentation, fluid volume and pelvic anatomy are  contained in that report.  Heather Huff 09/18/2014 2:30 PM  Clinical Impression and recommendations:  I have reviewed the sonogram results above.  Combined with the patient's current clinical course, below are my  impressions and any appropriate recommendations for management based on  the sonographic findings:  1. Symmetric fetal growth at 44%ile, normal for gestational age. 2 specifically reassuring fetal assessment with good BPP, fluid and  excellent Doppler flow 3 continue biweekly testing. Growth scan in 3 wk.  Heather Huff    [place] Ua Cord Doppler  10/05/2014   FOLLOW UP SONOGRAM   Heather Huff is in the office for a follow up sonogram for EFW,BPP,cord  dopplers.  She is a 24 y.o. year old G1P0 with Estimated Date of Delivery: 11/03/14 by  early ultrasound now at  [redacted]w[redacted]d weeks gestation. Thus far the pregnancy has  been complicated by CHTN,late pnc.Marland Kitchen  GESTATION: SINGLETON  PRESENTATION: cephalic  FETAL ACTIVITY:          Heart rate         135          The fetus is active.  AMNIOTIC FLUID: The amniotic fluid volume is  normal, 14.6 cm.  PLACENTA LOCALIZATION:  anterior GRADE 2  CERVIX: Limited view  ADNEXA: wnl   GESTATIONAL AGE AND  BIOMETRICS:  Gestational criteria: Estimated Date of Delivery: 11/03/14 by early  ultrasound now at [redacted]w[redacted]d  Previous  Scans:6              BIPARIETAL DIAMETER           8.74 cm         35+2 weeks  HEAD CIRCUMFERENCE           32.32 cm         36+4 weeks  ABDOMINAL CIRCUMFERENCE           30.70 cm         34+5 weeks  FEMUR LENGTH           6.95 cm         35+5 weeks                                                           AVERAGE EGA(BY THIS SCAN):   35+4 weeks                                                 ESTIMATED FETAL WEIGHT:        2623  grams, 34.6 %   BIOPHYSCIAL PROFILE:                                                                                                       COMMENTS GROSS BODY MOVEMENT                 2   TONE                2   RESPIRATIONS                2   AMNIOTIC FLUID                2                                                            SCORE:  8/8 (Note: NST was not performed as part of this antepartum testing)  DOPPLER FLOW STUDIES: UMBILICAL ARTERY RI RATIOS:   0.62, 0.59     ANATOMICAL SURVEY                                                                             COMMENTS CEREBRAL VENTRICLES  CHOROID PLEXUS     CEREBELLUM     CISTERNA MAGNA     NUCHAL REGION     ORBITS     NASAL BONE     NOSE/LIP     FACIAL PROFILE yes normal   4 CHAMBERED HEART     OUTFLOW TRACTS      DIAPHRAGM yes normal   STOMACH yes normal   RENAL REGION yes normal   BLADDER yes normal   CORD INSERTION     3 VESSEL CORD     SPINE     ARMS/HANDS     LEGS/FEET     GENITALIA   Pt doesn't want to know        SUSPECTED ABNORMALITIES:  no  QUALITY OF SCAN: Limited view   TECHNICIAN COMMENTS:  [place] 35+6wks,cephalic,afi 14.6cm,ant pl gr 2 ,bilat adnexa wnl,BPP 8/8,RI  .62,.59,efw 2623g 34.6%        A copy of this report including all images has been saved and backed up to  a second source for retrieval if needed. All measures and details of the  anatomical scan, placentation, fluid volume and pelvic anatomy are  contained in that report.  Heather Huff 10/05/2014 11:27 AM      [place] Ua Cord Doppler  09/26/2014   FOLLOW UP SONOGRAM   Heather Huff is in the office for a follow up sonogram for BPP,EFW, and  cord doppler.  She is a 24 y.o. year old G1P0 with Estimated Date of Delivery: 11/03/14 by  early ultrasound now at  [redacted]w[redacted]d weeks gestation. Thus far the pregnancy has  been complicated by [facility] and late [facility].Marland Kitchen  GESTATION: SINGLETON  PRESENTATION: cephalic  FETAL ACTIVITY:          Heart rate         151bpm           The fetus is active.  AMNIOTIC FLUID: The amniotic fluid volume is  normal, 11.5 cm.  PLACENTA LOCALIZATION:  anterior GRADE 2  CERVIX: Limited view  ADNEXA: The ovaries are normal.   GESTATIONAL AGE AND  BIOMETRICS:  Gestational criteria: Estimated Date of Delivery: 11/03/14 by LMP now at  [redacted]w[redacted]d  Previous Scans:5 BIOPHYSCIAL PROFILE:                                                                                                       COMMENTS GROSS BODY MOVEMENT                 2   TONE                2   RESPIRATIONS                2   AMNIOTIC FLUID                2  SCORE:  8/8 (Note: NST was not performed as part of this antepartum testing)  DOPPLER FLOW STUDIES: UMBILICAL ARTERY RI RATIOS:   0.68, 0.61              BIPARIETAL DIAMETER           8.29 cm         33+2 weeks  HEAD CIRCUMFERENCE           30.78 cm         34+2 weeks  ABDOMINAL CIRCUMFERENCE           29 cm         33 weeks  FEMUR LENGTH           6.49 cm         33+3 weeks                                                           AVERAGE EGA(BY THIS SCAN):   33+4 weeks                                                 ESTIMATED FETAL WEIGHT:        2190  grams, 44.8 % ANATOMICAL SURVEY                                                                             COMMENTS CEREBRAL VENTRICLES yes normal   CHOROID PLEXUS yes normal   CEREBELLUM     CISTERNA MAGNA     NUCHAL REGION     ORBITS     NASAL BONE     NOSE/LIP     FACIAL PROFILE yes normal   4 CHAMBERED HEART     OUTFLOW TRACTS     DIAPHRAGM yes normal   STOMACH yes normal   RENAL REGION yes normal   BLADDER yes normal   CORD INSERTION     3 VESSEL CORD     SPINE     ARMS/HANDS     LEGS/FEET     GENITALIA   Pt doesn't want to know         SUSPECTED ABNORMALITIES:  no  QUALITY OF SCAN: Limited view because of pt body habitus  TECHNICIAN COMMENTS:   [place] 33+3wks,efw 2190g 44.8%,cephalic,BPP 8/8,RI .68,.61,ant pl gr 2,bilat  adnexa wnl,afi  11.5cm   A copy of this report including all images has been saved and backed up to  a second source for retrieval if needed. All measures and details of the  anatomical scan, placentation, fluid volume and pelvic anatomy are  contained in that report.  Heather Huff 09/18/2014 2:30 PM  Clinical Impression and recommendations:  I have reviewed the sonogram results above.  Combined with the patient's current clinical course, below are my  impressions and any appropriate recommendations for management based on  the sonographic findings:  1. Symmetric fetal growth at 44%ile, normal for gestational age. 2 specifically reassuring fetal assessment with good BPP, fluid and  excellent Doppler flow 3 continue biweekly testing. Growth scan in 3 wk.  Heather Huff     MAU Course:   Assessment: Pregnancy  35 w 6 days chronic HTN with superimposed preeclampsia, with severe features  Plan: IV labetalol  X 2 doses, then begin Mag sulfate, admit to L& D for cytotec cervical ripening and subsequent foley bulb. Continue labetalol oral while cervical ripening.     Medication List    ASK your doctor about these medications        albuterol 108 (90 BASE) MCG/ACT inhaler  Commonly known as:  PROVENTIL HFA;VENTOLIN HFA  Inhale 1 puff into the lungs every 6 (six) hours as needed for wheezing or shortness of breath.     labetalol 200 MG tablet  Commonly known as:  NORMODYNE  Take 1 tablet (200 mg total) by mouth 2 (two) times daily.     PRENATAL PLUS IRON 29-1 MG Tabs  Take 1 daily        Heather Burrow, MD 10/05/2014 10:55 PM

## 2014-10-05 NOTE — MAU Note (Signed)
Sent from office for elevated blood pressures.

## 2014-10-05 NOTE — Progress Notes (Signed)
Fetal Surveillance Testing today:  Sonogram with BPP 8/8 Dopplers with robust flow normal AFI   High Risk Pregnancy Diagnosis(es):   Chronic Hypertension  G1P0 [redacted]w[redacted]d Estimated Date of Delivery: 11/03/14  Blood pressure 140/102, pulse 100, weight 275 lb (124.739 kg), last menstrual period 01/27/2014.  Urinalysis: Positive for 3+ protein   HPI: The patient is being seen today for ongoing management of chronic hypertension. Today she reports itching which began yesterday, mostly of the arms, no rash   BP weight and urine results all reviewed and noted. Patient reports good fetal movement, denies any bleeding and no rupture of membranes symptoms or regular contractions.  Fundal Height:  39 Fetal Heart rate:  145 Edema:  none  Patient is without complaints other than noted in her HPI. All questions were answered.  All lab and sonogram results have been reviewed. Comments: abnormal: 3+ protein   Assessment:  1.  Pregnancy at [redacted]w[redacted]d,  Estimated Date of Delivery: 11/03/14 :                          2.  Chronic hypertension on labetalol 200 BID                        3.  Increasing proteinuria                        4.  New onset moderate itching  Medication(s) Plans:  No changes  Treatment Plan:  Talked with Dr Alvester Morin.  Sending pt to MAU for extended BP evaluation, pre eclamptic lab evaluation and a bile acids  for evaluation for ICP  Follow up in tuesday weeks for appointment for high risk OB care, NST if indeed pt is released from MAU

## 2014-10-05 NOTE — MAU Note (Signed)
Was at office and BP was elevated and protein in urine, had itching on arms.  Was told to come to MAU and was told not to eat food.  No bleeding. No leaking. Baby moving well.

## 2014-10-05 NOTE — Progress Notes (Signed)
[place] 35+6wks,cephalic,afi 14.6cm,ant pl gr 2 ,bilat adnexa wnl,BPP 8/8,RI .62,.59,efw 2623g 34.6%

## 2014-10-06 ENCOUNTER — Encounter (HOSPITAL_COMMUNITY): Payer: Self-pay | Admitting: General Practice

## 2014-10-06 DIAGNOSIS — R87610 Atypical squamous cells of undetermined significance on cytologic smear of cervix (ASC-US): Secondary | ICD-10-CM | POA: Diagnosis present

## 2014-10-06 DIAGNOSIS — O1092 Unspecified pre-existing hypertension complicating childbirth: Secondary | ICD-10-CM | POA: Diagnosis present

## 2014-10-06 DIAGNOSIS — A568 Sexually transmitted chlamydial infection of other sites: Secondary | ICD-10-CM | POA: Diagnosis present

## 2014-10-06 DIAGNOSIS — O0930 Supervision of pregnancy with insufficient antenatal care, unspecified trimester: Secondary | ICD-10-CM | POA: Diagnosis not present

## 2014-10-06 DIAGNOSIS — J45909 Unspecified asthma, uncomplicated: Secondary | ICD-10-CM | POA: Diagnosis present

## 2014-10-06 DIAGNOSIS — Z8249 Family history of ischemic heart disease and other diseases of the circulatory system: Secondary | ICD-10-CM | POA: Diagnosis not present

## 2014-10-06 DIAGNOSIS — Z3A35 35 weeks gestation of pregnancy: Secondary | ICD-10-CM | POA: Diagnosis present

## 2014-10-06 DIAGNOSIS — O9989 Other specified diseases and conditions complicating pregnancy, childbirth and the puerperium: Secondary | ICD-10-CM | POA: Diagnosis present

## 2014-10-06 DIAGNOSIS — R87810 Cervical high risk human papillomavirus (HPV) DNA test positive: Secondary | ICD-10-CM | POA: Diagnosis present

## 2014-10-06 DIAGNOSIS — O1413 Severe pre-eclampsia, third trimester: Secondary | ICD-10-CM | POA: Diagnosis present

## 2014-10-06 DIAGNOSIS — Z23 Encounter for immunization: Secondary | ICD-10-CM | POA: Diagnosis not present

## 2014-10-06 DIAGNOSIS — Z79899 Other long term (current) drug therapy: Secondary | ICD-10-CM | POA: Diagnosis not present

## 2014-10-06 DIAGNOSIS — O113 Pre-existing hypertension with pre-eclampsia, third trimester: Secondary | ICD-10-CM | POA: Diagnosis present

## 2014-10-06 DIAGNOSIS — O9952 Diseases of the respiratory system complicating childbirth: Secondary | ICD-10-CM | POA: Diagnosis present

## 2014-10-06 DIAGNOSIS — O9832 Other infections with a predominantly sexual mode of transmission complicating childbirth: Secondary | ICD-10-CM | POA: Diagnosis present

## 2014-10-06 LAB — PROTEIN, URINE, 24 HOUR
Protein, 24H Urine: 372.4 mg/24 hr — ABNORMAL HIGH (ref 30.0–150.0)
Protein, Ur: 53.2 mg/dL

## 2014-10-06 LAB — TYPE AND SCREEN
ABO/RH(D): O POS
Antibody Screen: NEGATIVE

## 2014-10-06 LAB — GROUP B STREP BY PCR: Group B strep by PCR: POSITIVE — AB

## 2014-10-06 LAB — ABO/RH: ABO/RH(D): O POS

## 2014-10-06 LAB — HIV ANTIBODY (ROUTINE TESTING W REFLEX): HIV Screen 4th Generation wRfx: NONREACTIVE

## 2014-10-06 LAB — OB RESULTS CONSOLE GBS: GBS: POSITIVE

## 2014-10-06 LAB — RPR: RPR Ser Ql: NONREACTIVE

## 2014-10-06 MED ORDER — OXYCODONE-ACETAMINOPHEN 5-325 MG PO TABS: 2.0000 | ORAL_TABLET | ORAL | Status: DC | PRN

## 2014-10-06 MED ORDER — LACTATED RINGERS IV SOLN
INTRAVENOUS | Status: DC
Start: 2014-10-06 — End: 2014-10-11
  Administered 2014-10-06: 75 mL/h via INTRAVENOUS
  Administered 2014-10-06: 100 mL/h via INTRAVENOUS
  Administered 2014-10-06: 13:00:00 via INTRAVENOUS
  Administered 2014-10-06: 100 mL/h via INTRAVENOUS
  Administered 2014-10-07 – 2014-10-11 (×7): via INTRAVENOUS

## 2014-10-06 MED ORDER — OXYTOCIN BOLUS FROM INFUSION: 500.0000 mL | INTRAVENOUS | Status: DC

## 2014-10-06 MED ORDER — LACTATED RINGERS IV SOLN
500.0000 mL | INTRAVENOUS | Status: DC | PRN
  Administered 2014-10-10: 500 mL via INTRAVENOUS

## 2014-10-06 MED ORDER — OXYCODONE-ACETAMINOPHEN 5-325 MG PO TABS: 1.0000 | ORAL_TABLET | ORAL | Status: DC | PRN

## 2014-10-06 MED ORDER — ACETAMINOPHEN 325 MG PO TABS
650.0000 mg | ORAL_TABLET | ORAL | Status: DC | PRN
  Administered 2014-10-06 – 2014-10-08 (×2): 650 mg via ORAL
  Filled 2014-10-06 (×2): qty 2

## 2014-10-06 MED ORDER — CITRIC ACID-SODIUM CITRATE 334-500 MG/5ML PO SOLN
30.0000 mL | ORAL | Status: DC | PRN
  Filled 2014-10-06: qty 30

## 2014-10-06 MED ORDER — LIDOCAINE HCL (PF) 1 % IJ SOLN
30.0000 mL | INTRAMUSCULAR | Status: DC | PRN
  Administered 2014-10-11: 30 mL via SUBCUTANEOUS
  Filled 2014-10-06: qty 30

## 2014-10-06 MED ORDER — OXYTOCIN 40 UNITS IN LACTATED RINGERS INFUSION - SIMPLE MED
62.5000 mL/h | INTRAVENOUS | Status: DC
  Filled 2014-10-06: qty 1000

## 2014-10-06 MED ORDER — LABETALOL HCL 200 MG PO TABS
200.0000 mg | ORAL_TABLET | Freq: Two times a day (BID) | ORAL | Status: DC
  Administered 2014-10-06 – 2014-10-07 (×3): 200 mg via ORAL
  Filled 2014-10-06 (×4): qty 1

## 2014-10-06 MED ORDER — FLEET ENEMA 7-19 GM/118ML RE ENEM: 1.0000 | ENEMA | RECTAL | Status: DC | PRN

## 2014-10-06 MED ORDER — ONDANSETRON HCL 4 MG/2ML IJ SOLN: 4.0000 mg | Freq: Four times a day (QID) | INTRAMUSCULAR | Status: DC | PRN

## 2014-10-06 MED ORDER — FENTANYL CITRATE (PF) 100 MCG/2ML IJ SOLN
50.0000 ug | INTRAMUSCULAR | Status: DC | PRN
  Administered 2014-10-08: 50 ug via INTRAVENOUS
  Administered 2014-10-09 (×3): 100 ug via INTRAVENOUS
  Filled 2014-10-06 (×4): qty 2

## 2014-10-06 MED ORDER — FLEET ENEMA 7-19 GM/118ML RE ENEM
1.0000 | ENEMA | Freq: Once | RECTAL | Status: AC
  Administered 2014-10-06: 1 via RECTAL

## 2014-10-06 MED ORDER — MISOPROSTOL 25 MCG QUARTER TABLET
25.0000 ug | ORAL_TABLET | ORAL | Status: DC | PRN
  Administered 2014-10-06 (×5): 25 ug via VAGINAL
  Filled 2014-10-06 (×5): qty 0.25

## 2014-10-06 MED ORDER — MISOPROSTOL 25 MCG QUARTER TABLET
25.0000 ug | ORAL_TABLET | ORAL | Status: DC | PRN
  Administered 2014-10-06 – 2014-10-07 (×6): 25 ug via VAGINAL
  Filled 2014-10-06 (×6): qty 0.25

## 2014-10-06 MED ORDER — TERBUTALINE SULFATE 1 MG/ML IJ SOLN: 0.2500 mg | Freq: Once | INTRAMUSCULAR | Status: DC | PRN

## 2014-10-06 MED ORDER — MISOPROSTOL 50MCG HALF TABLET
50.0000 ug | ORAL_TABLET | ORAL | Status: DC | PRN
  Filled 2014-10-06: qty 0.5

## 2014-10-06 NOTE — Progress Notes (Signed)
cytotec placed. 

## 2014-10-06 NOTE — Progress Notes (Signed)
Admitted to rm 171 via w/c

## 2014-10-07 NOTE — Progress Notes (Signed)
Patient ID: Heather Huff, female   DOB: 01-Jun-1990, 24 y.o.   MRN: [id number] Heather Huff is a 24 y.o. G1P0 at [redacted]w[redacted]d.  Subjective: Denies UC's  Objective: BP 156/87 mmHg  Pulse 76  Temp(Src) 97.9 F (36.6 C) (Oral)  Resp 16  Ht  (1.651 m)  Wt 272 lb (123.378 kg)  BMI 45.26 kg/m2  SpO2 98%  LMP 01/27/2014   FHT:  FHR: 130 bpm, variability: mod,  accelerations:  15x15,  decelerations:  noe UC:   None Dilation: Fingertip Effacement (%): Thick Cervical Position: Posterior Station: -3 Presentation: Vertex Exam by:: Dorathy Kinsman, CNM  Labs: No results found for this or any previous visit (from the past 24 hour(s)).  Assessment / Plan: [redacted]w[redacted]d week IUP Labor: IOL for Pre-E Fetal Wellbeing:  Category I Pain Control:  none Anticipated MOD:  SVD Cytotec placed. FB when able  [place], CNM 10/07/2014 10:09 PM

## 2014-10-07 NOTE — Progress Notes (Signed)
Leonilda Cozby is a 24 y.o. G1P0 at [redacted]w[redacted]d admitted for induction of labor due to preeclampsia with severe features.  Subjective: Pt comfortable, not reporting cramping/contractions.  Objective: BP 148/103 mmHg  Pulse 73  Temp(Src) 97.9 F (36.6 C) (Oral)  Resp 16  Ht  (1.651 m)  Wt 123.378 kg (272 lb)  BMI 45.26 kg/m2  SpO2 98%  LMP 01/27/2014 I/O last 3 completed shifts: In: 5862.8 [P.O.:2132; I.V.:3730.8] Out: 2010 [Urine:2010] Total I/O In: 1532 [P.O.:532; I.V.:1000] Out: 800 [Urine:800]  FHT:  FHR: 135 bpm, variability: moderate,  accelerations:  Present,  decelerations:  Absent UC:   Rare, mild to palpation SVE:   Dilation: Fingertip Effacement (%): Thick Station: -3 Exam by:: Collene Gobble Cytotec placed vaginally for first time during this exam.  Pt tolerated well. Labs: Lab Results  Component Value Date   WBC 8.9 10/05/2014   HGB 11.3* 10/05/2014   HCT 33.5* 10/05/2014   MCV 75.1* 10/05/2014   PLT 183 10/05/2014    Assessment / Plan: Induction of labor due to preeclampsia Cytotec x 1-2 vaginally, then PO x 2-3 with unchanged cervix  Labor: Switched to vaginal adminstration of Cytotec.  Plan to recheck in 4 hours. Preeclampsia:  on magnesium sulfate Fetal Wellbeing:  Category I Pain Control:  Labor support without medications I/D:  n/a Anticipated MOD:  NSVD  LEFTWICH-KIRBY, LISA 10/07/2014, 5:32 PM

## 2014-10-07 NOTE — Progress Notes (Signed)
Heather Huff is a 25 y.o. G1P0 at [redacted]w[redacted]d admitted for induction of labor due to preeclampsia with severe features.  Subjective:   Objective: BP 148/103 mmHg  Pulse 73  Temp(Src) 97.9 F (36.6 C) (Oral)  Resp 16  Ht  (1.651 m)  Wt 123.378 kg (272 lb)  BMI 45.26 kg/m2  SpO2 98%  LMP 01/27/2014 I/O last 3 completed shifts: In: 5862.8 [P.O.:2132; I.V.:3730.8] Out: 2010 [Urine:2010] Total I/O In: 1532 [P.O.:532; I.V.:1000] Out: 800 [Urine:800]  FHT:  FHR: 135 bpm, variability: moderate,  accelerations:  Present,  decelerations:  Absent UC:   Rare, mild to palpation  SVE:   Dilation: Fingertip Effacement (%): Thick Station: -3 Exam by:: Collene Gobble Cytotec PO dose given by RN at 1200. Cervix firm and closed interior os.  Unable to place FB at this time.  Labs: Lab Results  Component Value Date   WBC 8.9 10/05/2014   HGB 11.3* 10/05/2014   HCT 33.5* 10/05/2014   MCV 75.1* 10/05/2014   PLT 183 10/05/2014    Assessment / Plan: Induction of labor due to preeclampsia S/P Cytotec PO multiple doses  Labor: Plan to recheck in 3 hours and place next dose of Cytotec vaginally. Preeclampsia:  on magnesium sulfate Fetal Wellbeing:  Category I Pain Control:  Labor support without medications I/D:  n/a Anticipated MOD:  NSVD  LEFTWICH-KIRBY, Khaleel Beckom 10/07/2014, 5:37 PM

## 2014-10-08 LAB — BILE ACIDS, TOTAL: Bile Acids Total: 4.6 umol/L — ABNORMAL LOW (ref 4.7–24.5)

## 2014-10-08 MED ORDER — MISOPROSTOL 25 MCG QUARTER TABLET
25.0000 ug | ORAL_TABLET | Freq: Once | ORAL | Status: DC
  Filled 2014-10-08: qty 0.25

## 2014-10-08 MED ORDER — LABETALOL HCL 5 MG/ML IV SOLN
20.0000 mg | INTRAVENOUS | Status: AC | PRN
  Administered 2014-10-08 – 2014-10-09 (×2): 40 mg via INTRAVENOUS
  Administered 2014-10-09: 20 mg via INTRAVENOUS
  Filled 2014-10-08 (×2): qty 16

## 2014-10-08 MED ORDER — HYDRALAZINE HCL 20 MG/ML IJ SOLN
10.0000 mg | Freq: Once | INTRAMUSCULAR | Status: AC
  Administered 2014-10-08: 10 mg via INTRAVENOUS
  Filled 2014-10-08: qty 1

## 2014-10-08 MED ORDER — HYDRALAZINE HCL 20 MG/ML IJ SOLN
10.0000 mg | INTRAMUSCULAR | Status: DC | PRN
  Administered 2014-10-08 (×2): 10 mg via INTRAVENOUS
  Filled 2014-10-08 (×3): qty 1

## 2014-10-08 MED ORDER — LABETALOL HCL 5 MG/ML IV SOLN
20.0000 mg | INTRAVENOUS | Status: AC | PRN
Start: 2014-10-08 — End: 2014-10-08
  Administered 2014-10-08 (×2): 20 mg via INTRAVENOUS
  Administered 2014-10-08: 40 mg via INTRAVENOUS
  Filled 2014-10-08: qty 4
  Filled 2014-10-08: qty 8
  Filled 2014-10-08: qty 4

## 2014-10-08 MED ORDER — HYDRALAZINE HCL 20 MG/ML IJ SOLN
10.0000 mg | Freq: Once | INTRAMUSCULAR | Status: AC | PRN
  Administered 2014-10-08: 10 mg via INTRAVENOUS

## 2014-10-08 MED ORDER — HYDRALAZINE HCL 20 MG/ML IJ SOLN
10.0000 mg | Freq: Two times a day (BID) | INTRAMUSCULAR | Status: DC | PRN
  Administered 2014-10-08 – 2014-10-09 (×2): 10 mg via INTRAVENOUS
  Filled 2014-10-08 (×3): qty 1

## 2014-10-08 MED ORDER — LABETALOL HCL 5 MG/ML IV SOLN
INTRAVENOUS | Status: AC
  Administered 2014-10-08: 40 mg via INTRAVENOUS
  Filled 2014-10-08: qty 4

## 2014-10-08 MED ORDER — LABETALOL HCL 200 MG PO TABS
400.0000 mg | ORAL_TABLET | Freq: Two times a day (BID) | ORAL | Status: DC
  Administered 2014-10-08 (×2): 400 mg via ORAL
  Filled 2014-10-08: qty 2
  Filled 2014-10-08: qty 4
  Filled 2014-10-08 (×2): qty 2
  Filled 2014-10-08: qty 4

## 2014-10-08 MED ORDER — MISOPROSTOL 200 MCG PO TABS
50.0000 ug | ORAL_TABLET | ORAL | Status: DC
  Administered 2014-10-08 (×2): 50 ug via ORAL
  Filled 2014-10-08 (×2): qty 0.5

## 2014-10-08 NOTE — Progress Notes (Signed)
Patient ID: Heather Huff, female   DOB: 22-Aug-1990, 22 y.o.   MRN: [id number] Heather Huff is a 24 y.o. G1P0 at [redacted]w[redacted]d.  Subjective: Denies cramping  Objective: BP 130/69 mmHg  Pulse 90  Temp(Src) 98.1 F (36.7 C) (Oral)  Resp 16  Ht  (1.651 m)  Wt 272 lb (123.378 kg)  BMI 45.26 kg/m2  SpO2 98%  LMP 01/27/2014  Patient Vitals for the past 24 hrs:  BP Temp Temp src Pulse Resp SpO2  10/08/14 0100 (!) 163/107 mmHg - - 74 16 -  10/08/14 0001 (!) 159/92 mmHg 98.1 F (36.7 C) Oral 82 16 -  10/07/14 2300 (!) 159/95 mmHg - - 76 18 -  10/07/14 2201 (!) 156/87 mmHg - - 76 16 -     FHT:  FHR: 125 bpm, variability: mod,  accelerations:  15x15,  decelerations:  none UC:   none  Dilation: 1 Effacement (%): 50 Cervical Position: Posterior Station: -3 Presentation: Vertex Exam by:: Dorathy Kinsman, CNM  Foley bulb place w/out difficulty  Labs: No results found for this or any previous visit (from the past 24 hour(s)).  Assessment / Plan: [redacted]w[redacted]d week IUP Labor: IOL Fetal Wellbeing:  Category I Pain Control:  None Anticipated MOD:  SVD Hydralazine 10 mg IV Q2 hours PRN for severe-range BMonea Pesantez PO Labetalol to 400 BID  Dorathy Kinsman, CNM 10/08/2014 7:08 AM

## 2014-10-08 NOTE — Progress Notes (Signed)
Pt complained of pain rated 5 of 10, Fentanyl given at 03:24am and noted effective. Pt called out complained of shortness of breath and requesting an inhaler at 03:36am. O2 sats 98-99% and lung sounds clear. MD notified and pt stated sensation of shortness of breath gone. Will continue to monitor.

## 2014-10-08 NOTE — Progress Notes (Signed)
Hydralazine  administered

## 2014-10-08 NOTE — Progress Notes (Signed)
Sitting up to eat.

## 2014-10-08 NOTE — Progress Notes (Signed)
Labetalol given at 2317

## 2014-10-08 NOTE — Progress Notes (Signed)
Patient ID: Heather Huff, female   DOB: 02-Feb-1991, 24 y.o.   MRN: [id number] Heather Huff is a 24 y.o. G1P0 at [redacted]w[redacted]d.  Subjective: Starting to feel cramping. Requesting pain medications.   Objective: BP 166/99 mmHg  Pulse 70  Temp(Src) 98.1 F (36.7 C) (Oral)  Resp 18  Ht  (1.651 m)  Wt 272 lb (123.378 kg)  BMI 45.26 kg/m2  SpO2 98%  LMP 01/27/2014   FHT:  FHR: 130 bpm, variability: mod,  accelerations:  15x15,  decelerations:  none UC:   occasional  Dilation: 1 Effacement (%): 50 Cervical Position: Posterior Station: -3 Presentation: Vertex Exam by:: Dorathy Kinsman, CNM  Labs: No results found for this or any previous visit (from the past 24 hour(s)).  Assessment / Plan: [redacted]w[redacted]d week IUP.  Labor: IOL for Pre-E. No PIH symptoms currently. S/p FB at around 1:40a. Continue cytotec.  Fetal Wellbeing:  Category I Pain Control:  Fentanyl Anticipated MOD:  SVD   Caryl Ada, DO 10/08/2014, 6:39 AM PGY-2, [facility]

## 2014-10-08 NOTE — Progress Notes (Signed)
This note also relates to the following rows which could not be included: BP - Cannot attach notes to unvalidated device data Pulse Rate - Cannot attach notes to unvalidated device data   Pt refuses cytotec at this time

## 2014-10-08 NOTE — Progress Notes (Signed)
This note also relates to the following rows which could not be included: BP - Cannot attach notes to unvalidated device data Pulse Rate - Cannot attach notes to unvalidated device data    room discussing use of cytotec in labor, pt now agrees to oral cytotec

## 2014-10-08 NOTE — Progress Notes (Signed)
Labor Progress Note  Heather Huff is a 24 y.o. G1P0 at [redacted]w[redacted]d  admitted for induction of labor due to preeclampsia.  S: Doing well. Patient voices no concerns but she looks as though she is getting exhausted from the process. Denies any PIH symptoms (HA, scotomata, RUQ pain).   O:  BP 177/97 mmHg  Pulse 92  Temp(Src) 98.9 F (37.2 C) (Oral)  Resp 18  Ht  (1.651 m)  Wt 272 lb (123.378 kg)  BMI 45.26 kg/m2  SpO2 97%  LMP 01/27/2014  Total I/O In: 627.5 [P.O.:120; I.V.:507.5] Out: 300 [Urine:300]  FHT:  FHR: 135 bpm, variability: moderate,  accelerations:  Abscent,  decelerations:  Absent UC:   regular, every 3-7 minutes SVE:   Dilation: 1 Effacement (%): 50 Station: -3 Exam by:: Dorathy Kinsman, CNM   Labs: Lab Results  Component Value Date   WBC 8.9 10/05/2014   HGB 11.3* 10/05/2014   HCT 33.5* 10/05/2014   MCV 75.1* 10/05/2014   PLT 183 10/05/2014    Assessment / Plan: 24 y.o. G1P0 [redacted]w[redacted]d not in labor. Doing well.  Induction of labor due to preeclampsia.   Labor: Progression slow. Still in induction phase of labor. FB still in place. Continue Cytotec PO.  PreE: Blood pressures starting to get more elevated. Continue controller medications and prn hydralazine. Also initiate preE BP protocol. Asymptomatic. Continue to monitor BP; goal is <160/110. Fetal Wellbeing:  Category II Pain Control:  Fentanyl Anticipated MOD:  NSVD  Expectant management   Caryl Ada, DO 10/08/2014, 11:11 PM PGY-2, [facility]

## 2014-10-09 ENCOUNTER — Other Ambulatory Visit: Payer: BLUE CROSS/BLUE SHIELD | Admitting: Obstetrics & Gynecology

## 2014-10-09 LAB — CBC
HEMATOCRIT: 35.3 % — AB (ref 36.0–46.0)
HEMOGLOBIN: 11.8 g/dL — AB (ref 12.0–15.0)
MCH: 25.4 pg — AB (ref 26.0–34.0)
MCHC: 33.4 g/dL (ref 30.0–36.0)
MCV: 75.9 fL — AB (ref 78.0–100.0)
Platelets: 164 10*3/uL (ref 150–400)
RBC: 4.65 MIL/uL (ref 3.87–5.11)
RDW: 17.4 % — ABNORMAL HIGH (ref 11.5–15.5)
WBC: 14.8 10*3/uL — ABNORMAL HIGH (ref 4.0–10.5)

## 2014-10-09 MED ORDER — OXYTOCIN 10 UNIT/ML IJ SOLN: 40.0000 [IU] | INTRAVENOUS | Status: DC

## 2014-10-09 MED ORDER — PENICILLIN G POTASSIUM 5000000 UNITS IJ SOLR
5.0000 10*6.[IU] | Freq: Once | INTRAVENOUS | Status: AC
  Administered 2014-10-09: 5 10*6.[IU] via INTRAVENOUS
  Filled 2014-10-09: qty 5

## 2014-10-09 MED ORDER — TERBUTALINE SULFATE 1 MG/ML IJ SOLN: 0.2500 mg | Freq: Once | INTRAMUSCULAR | Status: DC | PRN

## 2014-10-09 MED ORDER — OXYTOCIN 40 UNITS IN LACTATED RINGERS INFUSION - SIMPLE MED
1.0000 m[IU]/min | INTRAVENOUS | Status: DC
  Administered 2014-10-09: 2 m[IU]/min via INTRAVENOUS

## 2014-10-09 MED ORDER — ZOLPIDEM TARTRATE 5 MG PO TABS: 5.0000 mg | ORAL_TABLET | Freq: Every evening | ORAL | Status: DC | PRN

## 2014-10-09 MED ORDER — OXYTOCIN 40 UNITS IN LACTATED RINGERS INFUSION - SIMPLE MED
1.0000 m[IU]/min | INTRAVENOUS | Status: DC
  Administered 2014-10-10: 2 m[IU]/min via INTRAVENOUS
  Administered 2014-10-11: 26 m[IU]/min via INTRAVENOUS
  Filled 2014-10-09: qty 1000

## 2014-10-09 MED ORDER — LABETALOL HCL 300 MG PO TABS
600.0000 mg | ORAL_TABLET | Freq: Three times a day (TID) | ORAL | Status: DC
  Administered 2014-10-09 – 2014-10-11 (×9): 600 mg via ORAL
  Filled 2014-10-09 (×11): qty 2

## 2014-10-09 MED ORDER — NALBUPHINE HCL 10 MG/ML IJ SOLN
10.0000 mg | INTRAMUSCULAR | Status: DC | PRN
  Administered 2014-10-09 – 2014-10-10 (×5): 10 mg via INTRAVENOUS
  Filled 2014-10-09 (×6): qty 1

## 2014-10-09 MED ORDER — PENICILLIN G POTASSIUM 5000000 UNITS IJ SOLR
2.5000 10*6.[IU] | INTRAVENOUS | Status: DC
  Administered 2014-10-09 – 2014-10-11 (×11): 2.5 10*6.[IU] via INTRAVENOUS
  Filled 2014-10-09 (×15): qty 2.5

## 2014-10-09 MED ORDER — HYDRALAZINE HCL 20 MG/ML IJ SOLN
5.0000 mg | Freq: Once | INTRAMUSCULAR | Status: AC
  Administered 2014-10-09: 5 mg via INTRAVENOUS

## 2014-10-09 MED ORDER — BUTORPHANOL TARTRATE 1 MG/ML IJ SOLN
2.0000 mg | INTRAMUSCULAR | Status: DC | PRN
  Filled 2014-10-09 (×2): qty 2

## 2014-10-09 NOTE — Progress Notes (Signed)
This note also relates to the following rows which could not be included: BP - Cannot attach notes to unvalidated device data Pulse Rate - Cannot attach notes to unvalidated device data   Pt wakes up to breathe with uc's toco readjusted to trace on left side palpation mild

## 2014-10-09 NOTE — Progress Notes (Signed)
Labor Progress Note  Heather Huff is a 24 y.o. G1P0 at [redacted]w[redacted]d  admitted for induction of labor due to preeclampsia.  S: Patient with flat effect. No concerns. Is wondering when we will break her water. Has enjoyed her pit break and being able to shower.   O:  BP 131/72 mmHg  Pulse 92  Temp(Src) 97.9 F (36.6 C) (Oral)  Resp 18  Ht  (1.651 m)  Wt 272 lb (123.378 kg)  BMI 45.26 kg/m2  SpO2 97%  LMP 01/27/2014  Total I/O In: 825 [P.O.:118; I.V.:507; IV Piggyback:200] Out: 300 [Urine:300]  FHT:  FHR: 125 bpm, variability: moderate,  accelerations:  Present,  decelerations:  Absent UC:   regular, every 8-11 minutes SVE:   Dilation: 3 Effacement (%): 60 Station: -3 Exam by:: Susy Manor, RN    Labs: Lab Results  Component Value Date   WBC 14.8* 10/09/2014   HGB 11.8* 10/09/2014   HCT 35.3* 10/09/2014   MCV 75.9* 10/09/2014   PLT 164 10/09/2014    Assessment / Plan: 24 y.o. G1P0 [redacted]w[redacted]d not in labor. Still trying to ripen cervix. Induction of labor due to preeclampsia,  progressing well on pitocin  Labor: Progressing normally; had pit break and will now restart pitocin for cervical ripening. S/p FB and cytotec.  Fetal Wellbeing:  Category I Pain Control:  IV pain meds prn; plan for epidural when further along Anticipated MOD:  NSVD  Expectant management   Caryl Ada, DO 10/09/2014, 11:45 PM PGY-2, [facility]

## 2014-10-09 NOTE — Progress Notes (Addendum)
Labor Progress Note Length of stay/Hospital Day: 3  S: called by RN for elevated BP. Patient has been given Labetalol x3 escalating doses. Patient was given Hydralazine  and then  per my verbal order. BP responded. Patient is painfully contracting and has needed fentanyl.  Pitocin@10milliunits .   O:  BP 161/98 mmHg  Pulse 82  Temp(Src) 98.2 F (36.8 C) (Oral)  Resp 20  Ht  (1.651 m)  Wt 272 lb (123.378 kg)  BMI 45.26 kg/m2  SpO2 97%  LMP 01/27/2014 EFM:  [id number]: 3 Effacement (%): 50 Cervical Position: Posterior Station: Ballotable Presentation: Undeterminable Exam by:: brewer rnc  A&P: 24 y.o. G1P0 [redacted]w[redacted]d here for IOL 2/2 to preeclampsia with severe features by blood pressure currently on Magnesium. Has required multiple IV doses of labetalol and hydralazine #Labor: Induction. S/p cytotec and FB. Currently 3 cm and posterior. Not a candidate for AROM. Continue to titrate pit. Await descent prior to rupture. Will give nubain for continued pain prn.   #FWB: Cat I #GBS POS- PCN in active labor or with ROm #Preeclampsia: Continue to monitor BP. Will get CMP today to assure no changes in creatinine but CBC today showed mild reduction in PLT.  - Treat with hydralazine.   Federico Flake, MD 12:51 PM

## 2014-10-10 ENCOUNTER — Inpatient Hospital Stay (HOSPITAL_COMMUNITY): Payer: Medicaid Other | Admitting: Anesthesiology

## 2014-10-10 ENCOUNTER — Encounter (HOSPITAL_COMMUNITY): Payer: Self-pay | Admitting: Anesthesiology

## 2014-10-10 LAB — CBC
HCT: 33.8 % — ABNORMAL LOW (ref 36.0–46.0)
HEMATOCRIT: 34 % — AB (ref 36.0–46.0)
Hemoglobin: 11.3 g/dL — ABNORMAL LOW (ref 12.0–15.0)
Hemoglobin: 11.4 g/dL — ABNORMAL LOW (ref 12.0–15.0)
MCH: 25.1 pg — AB (ref 26.0–34.0)
MCH: 25.5 pg — AB (ref 26.0–34.0)
MCHC: 33.2 g/dL (ref 30.0–36.0)
MCHC: 33.7 g/dL (ref 30.0–36.0)
MCV: 75.6 fL — ABNORMAL LOW (ref 78.0–100.0)
MCV: 75.6 fL — AB (ref 78.0–100.0)
PLATELETS: 149 10*3/uL — AB (ref 150–400)
Platelets: 155 10*3/uL (ref 150–400)
RBC: 4.5 MIL/uL (ref 3.87–5.11)
RBC: 4.47 MIL/uL (ref 3.87–5.11)
RDW: 17.1 % — AB (ref 11.5–15.5)
RDW: 17.2 % — AB (ref 11.5–15.5)
WBC: 14.4 10*3/uL — ABNORMAL HIGH (ref 4.0–10.5)
WBC: 15 10*3/uL — ABNORMAL HIGH (ref 4.0–10.5)

## 2014-10-10 MED ORDER — EPHEDRINE 5 MG/ML INJ
10.0000 mg | INTRAVENOUS | Status: DC | PRN
  Filled 2014-10-10: qty 2

## 2014-10-10 MED ORDER — FENTANYL 2.5 MCG/ML BUPIVACAINE 1/10 % EPIDURAL INFUSION (WH - ANES): 14.0000 mL/h | INTRAMUSCULAR | Status: DC | PRN

## 2014-10-10 MED ORDER — PHENYLEPHRINE 40 MCG/ML (10ML) SYRINGE FOR IV PUSH (FOR BLOOD PRESSURE SUPPORT)
80.0000 ug | PREFILLED_SYRINGE | INTRAVENOUS | Status: DC | PRN
  Filled 2014-10-10: qty 2

## 2014-10-10 MED ORDER — HYDRALAZINE HCL 20 MG/ML IJ SOLN: 10.0000 mg | Freq: Once | INTRAMUSCULAR | Status: DC | PRN

## 2014-10-10 MED ORDER — LABETALOL HCL 5 MG/ML IV SOLN
20.0000 mg | INTRAVENOUS | Status: DC | PRN
  Administered 2014-10-10: 40 mg via INTRAVENOUS
  Administered 2014-10-10: 20 mg via INTRAVENOUS
  Filled 2014-10-10: qty 8

## 2014-10-10 MED ORDER — LABETALOL HCL 5 MG/ML IV SOLN
INTRAVENOUS | Status: AC
  Administered 2014-10-10: 20 mg via INTRAVENOUS
  Filled 2014-10-10: qty 4

## 2014-10-10 MED ORDER — PHENYLEPHRINE 40 MCG/ML (10ML) SYRINGE FOR IV PUSH (FOR BLOOD PRESSURE SUPPORT)
80.0000 ug | PREFILLED_SYRINGE | INTRAVENOUS | Status: DC | PRN
  Filled 2014-10-10: qty 20
  Filled 2014-10-10: qty 2

## 2014-10-10 MED ORDER — DIPHENHYDRAMINE HCL 50 MG/ML IJ SOLN: 12.5000 mg | INTRAMUSCULAR | Status: DC | PRN

## 2014-10-10 MED ORDER — LIDOCAINE HCL (PF) 1 % IJ SOLN
INTRAMUSCULAR | Status: DC | PRN
  Administered 2014-10-10 (×2): 4 mL via EPIDURAL

## 2014-10-10 MED ORDER — FENTANYL 2.5 MCG/ML BUPIVACAINE 1/10 % EPIDURAL INFUSION (WH - ANES)
14.0000 mL/h | INTRAMUSCULAR | Status: DC | PRN
  Administered 2014-10-10 – 2014-10-11 (×2): 14 mL/h via EPIDURAL
  Filled 2014-10-10 (×2): qty 125

## 2014-10-10 NOTE — Progress Notes (Addendum)
Labor Progress Note  Heather Huff is a 24 y.o. G1P0 at [redacted]w[redacted]d  admitted for induction of labor due to preeclampsia w/ severe features.  S: No complaints. Moderate contractions   O:  BP 154/78 mmHg  Pulse 81  Temp(Src) 98.1 F (36.7 C) (Oral)  Resp 18  Ht  (1.651 m)  Wt 272 lb (123.378 kg)  BMI 45.26 kg/m2  SpO2 97%  LMP 01/27/2014  Total I/O In: 994.3 [P.O.:90; I.V.:704.3; IV Piggyback:200] Out: 2000 [Urine:2000]  FHT:  130/mod/+a/-d UC:   q 5-10 SVE:   Dilation: 3 Effacement (%): 70 Station: -3 Exam by:: Dr. Ashok Pall   Labs: Lab Results  Component Value Date   WBC 14.4* 10/10/2014   HGB 11.3* 10/10/2014   HCT 34.0* 10/10/2014   MCV 75.6* 10/10/2014   PLT 155 10/10/2014    Assessment / Plan: 24 y.o. G1P0 [redacted]w[redacted]d.   Labor: Resumed pitocin this morning, now at 18, contractions mild/moderate, no cervical change since foley bulb. Continue to up-titrate pitocin; AROM when safe to do so. Preeclampsia: no severe range BPs today since increase in labetalol. Continuing Mg, no signs/symptoms toxicity. Fetal Wellbeing:  Category I Pain Control:  IV pain meds prn; plan for epidural when further along Anticipated MOD:  NSVD

## 2014-10-10 NOTE — Anesthesia Procedure Notes (Signed)
Epidural Patient location during procedure: OB Start time: 10/10/2014 5:44 PM  Staffing Anesthesiologist: Mal Amabile Performed by: anesthesiologist   Preanesthetic Checklist Completed: patient identified, site marked, surgical consent, pre-op evaluation, timeout performed, IV checked, risks and benefits discussed and monitors and equipment checked  Epidural Patient position: sitting Prep: site prepped and draped and DuraPrep Patient monitoring: continuous pulse ox and blood pressure Approach: midline Location: L3-L4 Injection technique: LOR air  Needle:  Needle type: Tuohy  Needle gauge: 17 G Needle length: 9 cm and 9 Needle insertion depth: 7 cm Catheter type: closed end flexible Catheter size: 19 Gauge Catheter at skin depth: 12 cm Test dose: negative and Other  Assessment Events: blood not aspirated, injection not painful, no injection resistance, negative IV test and no paresthesia  Additional Notes Patient identified. Risks and benefits discussed including failed block, incomplete  Pain control, post dural puncture headache, nerve damage, paralysis, blood pressure Changes, nausea, vomiting, reactions to medications-both toxic and allergic and post Partum back pain. All questions were answered. Patient expressed understanding and wished to proceed. Sterile technique was used throughout procedure. Epidural site was Dressed with sterile barrier dressing. No paresthesias, signs of intravascular injection Or signs of intrathecal spread were encountered.  Patient was more comfortable after the epidural was dosed. Please see RN's note for documentation of vital signs and FHR which are stable.

## 2014-10-10 NOTE — Anesthesia Preprocedure Evaluation (Signed)
Anesthesia Evaluation  Patient identified by MRN, date of birth, ID band Patient awake    Reviewed: Allergy & Precautions, Patient's Chart, lab work & pertinent test results, reviewed documented beta blocker date and time   Airway Mallampati: III  TM Distance: >3 FB Neck ROM: Full    Dental no notable dental hx. (+) Teeth Intact   Pulmonary asthma ,  breath sounds clear to auscultation  Pulmonary exam normal       Cardiovascular hypertension, Pt. on medications and Pt. on home beta blockers Normal cardiovascular examRhythm:Regular Rate:Normal     Neuro/Psych negative neurological ROS  negative psych ROS   GI/Hepatic Neg liver ROS, GERD-  Medicated,  Endo/Other  Morbid obesity  Renal/GU negative Renal ROS  negative genitourinary   Musculoskeletal negative musculoskeletal ROS (+)   Abdominal (+) + obese,   Peds  Hematology  (+) anemia ,   Anesthesia Other Findings   Reproductive/Obstetrics (+) Pregnancy 36 3/7 weeks Severe pre eclampsia                             Anesthesia Physical Anesthesia Plan  ASA: III  Anesthesia Plan: Epidural   Post-op Pain Management:    Induction:   Airway Management Planned: Natural Airway  Additional Equipment:   Intra-op Plan:   Post-operative Plan:   Informed Consent: I have reviewed the patients History and Physical, chart, labs and discussed the procedure including the risks, benefits and alternatives for the proposed anesthesia with the patient or authorized representative who has indicated his/her understanding and acceptance.     Plan Discussed with: Anesthesiologist  Anesthesia Plan Comments:         Anesthesia Quick Evaluation

## 2014-10-11 ENCOUNTER — Encounter (HOSPITAL_COMMUNITY): Payer: Self-pay | Admitting: *Deleted

## 2014-10-11 DIAGNOSIS — O0930 Supervision of pregnancy with insufficient antenatal care, unspecified trimester: Secondary | ICD-10-CM

## 2014-10-11 DIAGNOSIS — O113 Pre-existing hypertension with pre-eclampsia, third trimester: Secondary | ICD-10-CM

## 2014-10-11 DIAGNOSIS — O9832 Other infections with a predominantly sexual mode of transmission complicating childbirth: Secondary | ICD-10-CM

## 2014-10-11 DIAGNOSIS — Z3A35 35 weeks gestation of pregnancy: Secondary | ICD-10-CM

## 2014-10-11 DIAGNOSIS — R87810 Cervical high risk human papillomavirus (HPV) DNA test positive: Secondary | ICD-10-CM

## 2014-10-11 DIAGNOSIS — O1092 Unspecified pre-existing hypertension complicating childbirth: Secondary | ICD-10-CM

## 2014-10-11 DIAGNOSIS — R87610 Atypical squamous cells of undetermined significance on cytologic smear of cervix (ASC-US): Secondary | ICD-10-CM

## 2014-10-11 DIAGNOSIS — A568 Sexually transmitted chlamydial infection of other sites: Secondary | ICD-10-CM

## 2014-10-11 LAB — MRSA PCR SCREENING: MRSA by PCR: NEGATIVE

## 2014-10-11 MED ORDER — BENZOCAINE-MENTHOL 20-0.5 % EX AERO: 1.0000 "application " | INHALATION_SPRAY | CUTANEOUS | Status: DC | PRN

## 2014-10-11 MED ORDER — PRENATAL MULTIVITAMIN CH
1.0000 | ORAL_TABLET | Freq: Every day | ORAL | Status: DC
  Administered 2014-10-11 – 2014-10-13 (×3): 1 via ORAL
  Filled 2014-10-11 (×3): qty 1

## 2014-10-11 MED ORDER — SIMETHICONE 80 MG PO CHEW: 80.0000 mg | CHEWABLE_TABLET | ORAL | Status: DC | PRN

## 2014-10-11 MED ORDER — MAGNESIUM SULFATE 50 % IJ SOLN
2.0000 g/h | INTRAVENOUS | Status: DC
  Administered 2014-10-11 (×2): 2 g/h via INTRAVENOUS
  Filled 2014-10-11: qty 80

## 2014-10-11 MED ORDER — ACETAMINOPHEN 325 MG PO TABS: 650.0000 mg | ORAL_TABLET | ORAL | Status: DC | PRN

## 2014-10-11 MED ORDER — SENNOSIDES-DOCUSATE SODIUM 8.6-50 MG PO TABS
2.0000 | ORAL_TABLET | ORAL | Status: DC
  Administered 2014-10-11: 2 via ORAL
  Filled 2014-10-11: qty 2

## 2014-10-11 MED ORDER — TETANUS-DIPHTH-ACELL PERTUSSIS 5-2.5-18.5 LF-MCG/0.5 IM SUSP
0.5000 mL | Freq: Once | INTRAMUSCULAR | Status: AC
  Administered 2014-10-12: 0.5 mL via INTRAMUSCULAR
  Filled 2014-10-11 (×2): qty 0.5

## 2014-10-11 MED ORDER — LANOLIN HYDROUS EX OINT: TOPICAL_OINTMENT | CUTANEOUS | Status: DC | PRN

## 2014-10-11 MED ORDER — ZOLPIDEM TARTRATE 5 MG PO TABS: 5.0000 mg | ORAL_TABLET | Freq: Every evening | ORAL | Status: DC | PRN

## 2014-10-11 MED ORDER — LACTATED RINGERS IV SOLN: INTRAVENOUS | Status: DC

## 2014-10-11 MED ORDER — WITCH HAZEL-GLYCERIN EX PADS
1.0000 | MEDICATED_PAD | CUTANEOUS | Status: DC | PRN
Start: 2014-10-11 — End: 2014-10-13

## 2014-10-11 MED ORDER — IBUPROFEN 600 MG PO TABS
600.0000 mg | ORAL_TABLET | Freq: Four times a day (QID) | ORAL | Status: DC
  Administered 2014-10-11 – 2014-10-13 (×9): 600 mg via ORAL
  Filled 2014-10-11 (×10): qty 1

## 2014-10-11 MED ORDER — ONDANSETRON HCL 4 MG/2ML IJ SOLN: 4.0000 mg | INTRAMUSCULAR | Status: DC | PRN

## 2014-10-11 MED ORDER — OXYCODONE-ACETAMINOPHEN 5-325 MG PO TABS
1.0000 | ORAL_TABLET | ORAL | Status: DC | PRN
  Administered 2014-10-11: 1 via ORAL
  Filled 2014-10-11: qty 1

## 2014-10-11 MED ORDER — ONDANSETRON HCL 4 MG PO TABS: 4.0000 mg | ORAL_TABLET | ORAL | Status: DC | PRN

## 2014-10-11 MED ORDER — ALBUTEROL SULFATE (2.5 MG/3ML) 0.083% IN NEBU: 3.0000 mL | INHALATION_SOLUTION | Freq: Four times a day (QID) | RESPIRATORY_TRACT | Status: DC | PRN

## 2014-10-11 MED ORDER — DIPHENHYDRAMINE HCL 25 MG PO CAPS: 25.0000 mg | ORAL_CAPSULE | Freq: Four times a day (QID) | ORAL | Status: DC | PRN

## 2014-10-11 MED ORDER — OXYCODONE-ACETAMINOPHEN 5-325 MG PO TABS: 2.0000 | ORAL_TABLET | ORAL | Status: DC | PRN

## 2014-10-11 MED ORDER — INFLUENZA VAC SPLIT QUAD 0.5 ML IM SUSY
0.5000 mL | PREFILLED_SYRINGE | INTRAMUSCULAR | Status: AC
  Administered 2014-10-12: 0.5 mL via INTRAMUSCULAR
  Filled 2014-10-11: qty 0.5

## 2014-10-11 MED ORDER — LACTATED RINGERS IV SOLN
INTRAVENOUS | Status: DC
  Administered 2014-10-11 – 2014-10-12 (×2): via INTRAVENOUS

## 2014-10-11 MED ORDER — DIBUCAINE 1 % RE OINT: 1.0000 "application " | TOPICAL_OINTMENT | RECTAL | Status: DC | PRN

## 2014-10-11 NOTE — Progress Notes (Signed)
Kacey Dysert is a 24 y.o. G1P0 at [redacted]w[redacted]d   Subjective: Comfortable w/ epidural, but having some right groin pain; mag sulfate  Objective: BP 140/87 mmHg  Pulse 91  Temp(Src) 99.5 F (37.5 C) (Oral)  Resp 18  Ht  (1.651 m)  Wt 123.378 kg (272 lb)  BMI 45.26 kg/m2  SpO2 99%  LMP 01/27/2014 I/O last 3 completed shifts: In: 7772 [P.O.:1043; I.V.:5826.5; Other:2.6; IV Piggyback:900] Out: 8350 [Urine:8350] Total I/O In: 1303 [P.O.:100; I.V.:1003; IV Piggyback:200] Out: 675 [Urine:675]  FHT:  FHR: 130s bpm, variability: minimal ,  accelerations:  Present,  decelerations:  Absent- occ mi variables UC:   q 3-6 mins w/ Pit @ 52mu/min and adequate MVUs mostly SVE:   Dilation: 7 Effacement (%): 100 Station: 0 Exam by:: Philipp Deputy, CNM  Labs: Lab Results  Component Value Date   WBC 15.0* 10/10/2014   HGB 11.4* 10/10/2014   HCT 33.8* 10/10/2014   MCV 75.6* 10/10/2014   PLT 149* 10/10/2014    Assessment / Plan: Active labor cHTN w/ superimposed pre-e  Examine cx in 2 hours  Eulogio Requena CNM 10/11/2014, 3:54 AM

## 2014-10-11 NOTE — Lactation Note (Signed)
This note was copied from the chart of Heather Eboni Coval. Lactation Consultation Note  Initial visit made to mom in AICU.  Providing Breastmilk For Your Baby in NICU  Booklet given.  Baby is 6 hours old and in the NICU.  This is mom's first baby.  DEBP set up and initiated.  Instructed to pump/hand express every 3 hours x 15 minutes to establish milk supply.  A few mls of colostrum obtained with first pumping.  Discussed a pump for discharge and she states she will most likely purchase one.  Informed of rental option.  Encouraged to call with concerns/assist prn.  Patient Name: Heather Huff IONGE'X Date: 10/11/2014 Reason for consult: Initial assessment;NICU baby;Late preterm infant   Maternal Data    Feeding    LATCH Score/Interventions                      Lactation Tools Discussed/Used WIC Program: No Pump Review: Setup, frequency, and cleaning;Milk Storage Initiated by:: LC Date initiated:: 10/11/14   Consult Status Consult Status: Follow-up Date: 10/12/14 Follow-up type: In-patient    Huston Foley 10/11/2014, 1:12 PM

## 2014-10-11 NOTE — Progress Notes (Signed)
Heather Huff is a 24 y.o. G1P0 at [redacted]w[redacted]d   Subjective: Complete since 0555 and pushing x 10 mins now  Objective: BP 142/86 mmHg  Pulse 91  Temp(Src) 99.5 F (37.5 C) (Oral)  Resp 20  Ht  (1.651 m)  Wt 123.378 kg (272 lb)  BMI 45.26 kg/m2  SpO2 99%  LMP 01/27/2014 I/O last 3 completed shifts: In: 7772 [P.O.:1043; I.V.:5826.5; Other:2.6; IV Piggyback:900] Out: 8350 [Urine:8350] Total I/O In: 1728 [P.O.:150; I.V.:1278; IV Piggyback:300] Out: 850 [Urine:850]  FHT:  FHR: 130s bpm, variability: moderate,  accelerations:  Present,  decelerations:  Present having early onset variables to 60-70s w/ each ctx; amnioinfusion going w/ slight improvement UC:   regular, every 3-5 minutes SVE:   Dilation: 10 Effacement (%): 100 Station: +2 Exam by:: Violeta Gelinas, RN  Labs: Lab Results  Component Value Date   WBC 15.0* 10/10/2014   HGB 11.4* 10/10/2014   HCT 33.8* 10/10/2014   MCV 75.6* 10/10/2014   PLT 149* 10/10/2014    Assessment / Plan: End 1st stage  Continue to push w/ ctx Anticipate SVD  Deamber Buckhalter CNM 10/11/2014, 6:16 AM

## 2014-10-11 NOTE — Progress Notes (Addendum)
Heather Huff is a 24 y.o. G1P0 at [redacted]w[redacted]d   Subjective: Comfortable w/ epidural  Objective: BP 143/80 mmHg  Pulse 94  Temp(Src) 98.2 F (36.8 C) (Oral)  Resp 20  Ht  (1.651 m)  Wt 123.378 kg (272 lb)  BMI 45.26 kg/m2  SpO2 99%  LMP 01/27/2014 I/O last 3 completed shifts: In: 7772 [P.O.:1043; I.V.:5826.5; Other:2.6; IV Piggyback:900] Out: 8350 [Urine:8350] Total I/O In: 703 [P.O.:100; I.V.:503; IV Piggyback:100] Out: 400 [Urine:400]  FHT:  FHR: 120s bpm, variability: minimal ,  accelerations:  Present,  decelerations:  Absent UC:   irregular, every 2-6 minutes w/ Pit @ 88mu/min; MVUs mostly adequate SVE:   Dilation: 5 Effacement (%): 80 Station: -2 Exam by:: Pincus Badder CNM- vtx lifted for mod amt pink-tinged fluid  Labs: Lab Results  Component Value Date   WBC 15.0* 10/10/2014   HGB 11.4* 10/10/2014   HCT 33.8* 10/10/2014   MCV 75.6* 10/10/2014   PLT 149* 10/10/2014    Assessment / Plan: IOL process cHTN w/ severe pre-e (BPs less than severe range) Asthma  Will continue to run Pitocin to keep ctx adequate  SHAW, KIMBERLY CNM 10/11/2014, 12:04 AM

## 2014-10-11 NOTE — H&P (Signed)
MAU Provider Note by Tilda Burrow, MD at 10/05/2014 10:55 PM    Author: Tilda Burrow, MD Service: Obstetrics/Gynecology Author Type: Physician   Filed: 10/05/2014 11:11 PM Note Time: 10/05/2014 10:55 PM Status: Signed   Editor: Tilda Burrow, MD (Physician)     Expand All Collapse All   Obstetric Attending MAU Note  Chief Complaint: Hypertension   First Provider Initiated Contact with Patient 10/05/14 2152   HPI: Heather Huff is a 24 y.o. G1P0 at [redacted]w[redacted]d who presents to maternity admissions reporting elevated pressures at office visit today accompanied with 3+ proteinuria. She denies headache , scotoma, ruq pain.. Denies contractions, leakage of fluid or vaginal bleeding. Good fetal movement. Since arrival here She has had severe range bp's , and PIH labs are normal for platelets, LFT's, but Pr/Cr ratio elevated at 0.36, and 100 mg/dl on urinalysis. She has been on labetalol at 200 mg bid, with satisfactory biweekly testing, but with severe bp range, will admit for Mag sulfate, betamethasone, and IOL for chronic HTN with superimposed preeclampsia with severe features.  Pregnancy Course: Receives care at [facility]  Patient Active Problem List   Diagnosis Date Noted  . Chronic hypertension during pregnancy, antepartum 08/29/2014  . Chlamydia 05/30/2014  . ASCUS with positive high risk HPV 05/30/2014  . Late prenatal care 05/22/2014  . Trichomonal vaginitis 05/22/2014  . Asthma   . Supervision of other high-risk pregnancy 04/17/2014  . Nausea and vomiting during pregnancy 04/17/2014    Past Medical History  Diagnosis Date  . Allergy     seasonal  . Pregnant 04/17/2014  . Nausea and vomiting during pregnancy 04/17/2014  . Asthma     uses inhaler prn    OB History  Gravida Para Term Preterm AB SAB TAB Ectopic Multiple Living  1             # Outcome Date GA Lbr Len/2nd Weight Sex  Delivery Anes PTL Lv  1 Current               No past surgical history on file.  Family History: Family History  Problem Relation Age of Onset  . Hypertension Mother   . Thyroid disease Mother   . Asthma Sister   . Allergies Brother   . Arthritis Maternal Grandmother   . Hypertension Maternal Grandmother     Social History: Social History  Substance Use Topics  . Smoking status: Never Smoker   . Smokeless tobacco: Never Used  . Alcohol Use: No     Comment: not now    Allergies: No Known Allergies  Prescriptions prior to admission  Medication Sig Dispense Refill Last Dose  . albuterol (PROVENTIL HFA;VENTOLIN HFA) 108 (90 BASE) MCG/ACT inhaler Inhale 1 puff into the lungs every 6 (six) hours as needed for wheezing or shortness of breath.    10/05/2014 at 1500  . labetalol (NORMODYNE) 200 MG tablet Take 1 tablet (200 mg total) by mouth 2 (two) times daily. 60 tablet 3 10/05/2014 at 0850  . Prenatal Vit-Iron Carbonyl-FA (PRENATAL PLUS IRON) 29-1 MG TABS Take 1 daily (Patient taking differently: Take 1 tablet by mouth daily. ) 30 tablet 11 10/04/2014 at Unknown time    ROS: Pertinent findings in history of present illness.  Physical Exam  Blood pressure 167/117, pulse 74, temperature 98.3 F (36.8 C), temperature source Oral, resp. rate 16, height 5\' 5"  (1.651 m), weight 123.378 kg (272 lb), last menstrual period 01/27/2014, SpO2 98 %.  Temp: [98.3 F (36.8 C)] 98.3 F (36.8 C) (08/26 2056) Pulse Rate: [70-100] 74 (08/26 2247) Resp: [16] 16 (08/26 2056) BP: (140-183)/(96-121) 167/117 mmHg (08/26 2247) SpO2: [98 %] 98 % (08/26 2056) Weight: [123.378 kg (272 lb)-124.739 kg (275 lb)] 123.378 kg (272 lb) (08/26 2049)  CONSTITUTIONAL: Well-developed, well-nourished female in no acute distress.  HENT: Normocephalic, atraumatic, External right and left ear normal. Oropharynx is clear  and moist EYES: Conjunctivae and EOM are normal. Pupils are equal, round, and reactive to light. No scleral icterus.  NECK: Normal range of motion, supple, no masses SKIN: Skin is warm and dry. No rash noted. Not diaphoretic. No erythema. No pallor. NEUROLGIC: Alert and oriented to person, place, and time. Normal reflexes, muscle tone coordination. No cranial nerve deficit noted. PSYCHIATRIC: Normal mood and affect. Normal behavior. Normal judgment and thought content. CARDIOVASCULAR: Normal heart rate noted, regular rhythm RESPIRATORY: Effort and breath sounds normal, no problems with respiration noted ABDOMEN: Soft, nontender, nondistended, gravid appropriate for gestational age MUSCULOSKELETAL: Normal range of motion. No edema and no tenderness. 2+ distal pulses.  SPECULUM EXAM: NEFG, physiologic discharge, no blood, cervix clean Dilation: Closed Effacement (%): Thick Cervical Position: midposition Exam by:: Emelda Fear, MD  FHT: Baseline 140 , moderate variability, accelerations present, no decelerations Contractions: none mins  Labs:  Lab Results Last 24 Hours    Results for orders placed or performed during the hospital encounter of 10/05/14 (from the past 24 hour(s))  Urinalysis, Routine w reflex microscopic (not at [facility]) Status: Abnormal   Collection Time: 10/05/14 9:00 PM  Result Value Ref Range   Color, Urine YELLOW YELLOW   APPearance HAZY (A) CLEAR   Specific Gravity, Urine >1.030 (H) 1.005 - 1.030   pH 5.5 5.0 - 8.0   Glucose, UA NEGATIVE NEGATIVE mg/dL   Hgb urine dipstick TRACE (A) NEGATIVE   Bilirubin Urine SMALL (A) NEGATIVE   Ketones, ur 15 (A) NEGATIVE mg/dL   Protein, ur 161 (A) NEGATIVE mg/dL   Urobilinogen, UA 0.2 0.0 - 1.0 mg/dL   Nitrite NEGATIVE NEGATIVE   Leukocytes, UA MODERATE (A) NEGATIVE  Urine microscopic-add on Status: Abnormal   Collection Time: 10/05/14 9:00 PM  Result  Value Ref Range   Squamous Epithelial / LPF MANY (A) RARE   WBC, UA 11-20 <3 WBC/hpf   RBC / HPF 0-2 <3 RBC/hpf   Bacteria, UA MANY (A) RARE   Urine-Other MUCOUS PRESENT   Protein / creatinine ratio, urine Status: Abnormal   Collection Time: 10/05/14 9:00 PM  Result Value Ref Range   Creatinine, Urine 492.00 mg/dL   Total Protein, Urine 179 mg/dL   Protein Creatinine Ratio 0.36 (H) 0.00 - 0.15 mg/mg[Cre]  CBC Status: Abnormal   Collection Time: 10/05/14 10:00 PM  Result Value Ref Range   WBC 8.9 4.0 - 10.5 K/uL   RBC 4.46 3.87 - 5.11 MIL/uL   Hemoglobin 11.3 (L) 12.0 - 15.0 g/dL   HCT 09.6 (L) 04.5 - 40.9 %   MCV 75.1 (L) 78.0 - 100.0 fL   MCH 25.3 (L) 26.0 - 34.0 pg   MCHC 33.7 30.0 - 36.0 g/dL   RDW 81.1 (H) 91.4 - 78.2 %   Platelets 183 150 - 400 K/uL  Comprehensive metabolic panel Status: Abnormal   Collection Time: 10/05/14 10:00 PM  Result Value Ref Range   Sodium 137 135 - 145 mmol/L   Potassium 3.9 3.5 - 5.1 mmol/L   Chloride 108 101 - 111 mmol/L   CO2 19 (  L) 22 - 32 mmol/L   Glucose, Bld 69 65 - 99 mg/dL   BUN 7 6 - 20 mg/dL   Creatinine, Ser 1.61 0.44 - 1.00 mg/dL   Calcium 8.8 (L) 8.9 - 10.3 mg/dL   Total Protein 6.5 6.5 - 8.1 g/dL   Albumin 2.8 (L) 3.5 - 5.0 g/dL   AST 20 15 - 41 U/L   ALT 16 14 - 54 U/L   Alkaline Phosphatase 160 (H) 38 - 126 U/L   Total Bilirubin 0.6 0.3 - 1.2 mg/dL   GFR calc non Af Amer >60 >60 mL/min   GFR calc Af Amer >60 >60 mL/min   Anion gap 10 5 - 15      Imaging:   Imaging Results    US Ob Follow Up  10/05/2014 FOLLOW UP SONOGRAM Heather Huff is in the office for a follow up sonogram for EFW,BPP,cord dopplers. She is a 24 y.o. year old G1P0 with Estimated Date of Delivery: 11/03/14 by early ultrasound now at [redacted]w[redacted]d weeks gestation. Thus far the pregnancy  has been complicated by CHTN,late pnc.Marland Kitchen GESTATION: SINGLETON PRESENTATION: cephalic FETAL ACTIVITY: Heart rate 135 The fetus is active. AMNIOTIC FLUID: The amniotic fluid volume is normal, 14.6 cm. PLACENTA LOCALIZATION: anterior GRADE 2 CERVIX: Limited view ADNEXA: wnl GESTATIONAL AGE AND BIOMETRICS: Gestational criteria: Estimated Date of Delivery: 11/03/14 by early ultrasound now at [redacted]w[redacted]d Previous Scans:6 BIPARIETAL DIAMETER 8.74 cm 35+2 weeks HEAD CIRCUMFERENCE  32.32 cm 36+4 weeks ABDOMINAL CIRCUMFERENCE 30.70 cm 34+5 weeks FEMUR LENGTH 6.95 cm 35+5 weeks  AVERAGE EGA(BY THIS SCAN): 35+4 weeks ESTIMATED FETAL WEIGHT: 2623 grams, 34.6 % BIOPHYSCIAL PROFILE:  COMMENTS GROSS BODY MOVEMENT 2 TONE 2 RESPIRATIONS 2 AMNIOTIC FLUID 2 SCORE: 8/8 (Note: NST was not performed as part of this antepartum testing) DOPPLER FLOW STUDIES: UMBILICAL ARTERY RI RATIOS: 0.62, 0.59 ANATOMICAL SURVEY  COMMENTS CEREBRAL VENTRICLES CHOROID PLEXUS CEREBELLUM CISTERNA MAGNA NUCHAL REGION ORBITS NASAL BONE  NOSE/LIP FACIAL PROFILE yes normal 4 CHAMBERED HEART OUTFLOW TRACTS DIAPHRAGM yes normal STOMACH yes normal RENAL REGION yes normal BLADDER yes normal CORD INSERTION 3 VESSEL CORD SPINE ARMS/HANDS LEGS/FEET GENITALIA Pt doesn't want to know SUSPECTED ABNORMALITIES: no QUALITY OF SCAN: Limited view TECHNICIAN  COMMENTS: [place] 35+6wks,cephalic,afi 14.6cm,ant pl gr 2 ,bilat adnexa wnl,BPP 8/8,RI .62,.59,efw 2623g 34.6% A copy of this report including all images has been saved and backed up to a second source for retrieval if needed. All measures and details of the anatomical scan, placentation, fluid volume and pelvic anatomy are contained in that report. Karie Chimera 10/05/2014 11:27 AM   US Ob Follow Up  09/26/2014 FOLLOW UP SONOGRAM Heather Huff is in the office for a follow up sonogram for BPP,EFW, and cord doppler. She is a 24 y.o. year old G1P0 with Estimated Date of Delivery: 11/03/14 by early ultrasound now at [redacted]w[redacted]d weeks gestation. Thus far the pregnancy has been complicated by [facility] and late [facility].Marland Kitchen GESTATION: SINGLETON PRESENTATION: cephalic FETAL ACTIVITY: Heart rate 151bpm The fetus is active. AMNIOTIC FLUID: The amniotic fluid volume is normal, 11.5 cm. PLACENTA LOCALIZATION: anterior GRADE 2 CERVIX: Limited view ADNEXA: The ovaries are normal. GESTATIONAL AGE AND BIOMETRICS: Gestational criteria: Estimated Date of Delivery: 11/03/14 by LMP now at [redacted]w[redacted]d Previous Scans:5 BIOPHYSCIAL PROFILE:  COMMENTS GROSS BODY MOVEMENT 2 TONE  2 RESPIRATIONS 2 AMNIOTIC FLUID 2  SCORE: 8/8 (Note: NST was not performed as part of this antepartum testing) DOPPLER FLOW STUDIES: UMBILICAL ARTERY RI RATIOS: 0.68, 0.61  BIPARIETAL DIAMETER 8.29  cm 33+2 weeks HEAD CIRCUMFERENCE 30.78 cm 34+2 weeks ABDOMINAL CIRCUMFERENCE  29 cm 33 weeks FEMUR LENGTH 6.49 cm 33+3 weeks  AVERAGE EGA(BY THIS SCAN): 33+4  weeks  ESTIMATED FETAL WEIGHT: 2190 grams, 44.8 % ANATOMICAL SURVEY COMMENTS CEREBRAL VENTRICLES yes normal CHOROID PLEXUS yes normal CEREBELLUM CISTERNA MAGNA NUCHAL REGION ORBITS NASAL BONE NOSE/LIP FACIAL PROFILE yes normal 4 CHAMBERED HEART OUTFLOW TRACTS DIAPHRAGM yes normal STOMACH yes normal RENAL REGION yes normal BLADDER yes normal CORD INSERTION 3 VESSEL CORD SPINE ARMS/HANDS LEGS/FEET GENITALIA Pt doesn't want to know SUSPECTED ABNORMALITIES: no QUALITY OF SCAN: Limited view because of pt body habitus TECHNICIAN COMMENTS: [place] 33+3wks,efw 2190g 44.8%,cephalic,BPP 8/8,RI .68,.61,ant pl gr 2,bilat adnexa wnl,afi 11.5cm A copy of this report including all images has been saved and backed up to a second source for retrieval if needed. All measures and details of the anatomical scan, placentation, fluid volume and pelvic anatomy are contained in that report. Amber Flora Lipps 09/18/2014 2:30 PM Clinical Impression and recommendations: I have reviewed the sonogram results above. Combined with the patient's current clinical course, below are my impressions and any appropriate recommendations for management based on the sonographic findings: 1. Symmetric fetal growth at 44%ile, normal for gestational age. 2 specifically reassuring fetal assessment with good BPP, fluid and excellent Doppler flow 3 continue biweekly testing. Growth scan in 3 wk. FERGUSON,JOHN V   US Fetal Bpp Wo Non Stress  10/05/2014 FOLLOW UP SONOGRAM Heather Huff is in the office for a follow up sonogram for EFW,BPP,cord dopplers. She is a 24 y.o. year old G1P0 with Estimated Date of Delivery: 11/03/14 by early ultrasound now at [redacted]w[redacted]d weeks gestation. Thus far the pregnancy has been complicated by CHTN,late pnc.Marland Kitchen  GESTATION: SINGLETON PRESENTATION: cephalic FETAL ACTIVITY: Heart rate 135 The fetus is active. AMNIOTIC FLUID: The amniotic fluid volume is normal, 14.6 cm. PLACENTA LOCALIZATION: anterior GRADE 2 CERVIX: Limited view ADNEXA: wnl GESTATIONAL AGE AND BIOMETRICS: Gestational criteria: Estimated Date of Delivery: 11/03/14 by early ultrasound now at [redacted]w[redacted]d Previous Scans:6 BIPARIETAL DIAMETER 8.74 cm 35+2 weeks HEAD CIRCUMFERENCE  32.32 cm 36+4 weeks ABDOMINAL CIRCUMFERENCE 30.70 cm 34+5 weeks FEMUR LENGTH 6.95 cm 35+5 weeks  AVERAGE EGA(BY THIS SCAN): 35+4 weeks ESTIMATED FETAL WEIGHT: 2623 grams, 34.6 % BIOPHYSCIAL PROFILE:  COMMENTS GROSS BODY MOVEMENT 2 TONE 2 RESPIRATIONS 2 AMNIOTIC FLUID 2 SCORE: 8/8 (Note: NST was not performed as part of this antepartum testing) DOPPLER FLOW STUDIES: UMBILICAL ARTERY RI RATIOS: 0.62, 0.59 ANATOMICAL SURVEY  COMMENTS CEREBRAL VENTRICLES CHOROID PLEXUS CEREBELLUM CISTERNA MAGNA NUCHAL REGION ORBITS NASAL BONE  NOSE/LIP FACIAL PROFILE yes normal 4 CHAMBERED HEART OUTFLOW TRACTS DIAPHRAGM yes normal STOMACH yes normal RENAL REGION yes normal BLADDER yes normal CORD INSERTION 3 VESSEL CORD SPINE ARMS/HANDS LEGS/FEET GENITALIA Pt doesn't want to know SUSPECTED ABNORMALITIES: no QUALITY OF SCAN: Limited view TECHNICIAN COMMENTS: [place] 35+6wks,cephalic,afi  14.6cm,ant pl gr 2 ,bilat adnexa wnl,BPP 8/8,RI .62,.59,efw 2623g 34.6% A copy of this report including all images has been saved and backed up to a second source for retrieval if needed. All measures and details of the anatomical scan, placentation, fluid volume and pelvic anatomy are contained in that report. Amber Flora Lipps 10/05/2014 11:27 AM   US Fetal Bpp W/o Non Stress  09/26/2014 FOLLOW UP SONOGRAM Heather Huff is in the office for a follow up sonogram for BPP,EFW, and cord doppler. She is a 23 y.o. year old G1P0 with Estimated Date of Delivery: 11/03/14 by early ultrasound now at [redacted]w[redacted]d weeks gestation. Thus  far the pregnancy has been complicated by [facility] and late [facility].Marland Kitchen GESTATION: SINGLETON PRESENTATION: cephalic FETAL ACTIVITY: Heart rate 151bpm The fetus is active. AMNIOTIC FLUID: The amniotic fluid volume is normal, 11.5 cm. PLACENTA LOCALIZATION: anterior GRADE 2 CERVIX: Limited view ADNEXA: The ovaries are normal. GESTATIONAL AGE AND BIOMETRICS: Gestational criteria: Estimated Date of Delivery: 11/03/14 by LMP now at [redacted]w[redacted]d Previous Scans:5 BIOPHYSCIAL PROFILE:  COMMENTS GROSS BODY MOVEMENT 2 TONE  2 RESPIRATIONS 2 AMNIOTIC FLUID 2  SCORE: 8/8 (Note: NST was not performed as part of this antepartum testing) DOPPLER FLOW STUDIES: UMBILICAL ARTERY RI RATIOS: 0.68, 0.61  BIPARIETAL DIAMETER 8.29 cm 33+2 weeks HEAD CIRCUMFERENCE 30.78 cm 34+2 weeks ABDOMINAL CIRCUMFERENCE  29 cm 33 weeks FEMUR LENGTH 6.49 cm 33+3 weeks  AVERAGE EGA(BY THIS SCAN): 33+4 weeks   ESTIMATED FETAL WEIGHT: 2190 grams, 44.8 % ANATOMICAL SURVEY COMMENTS CEREBRAL VENTRICLES yes normal CHOROID PLEXUS yes normal CEREBELLUM CISTERNA MAGNA NUCHAL REGION ORBITS NASAL BONE NOSE/LIP FACIAL PROFILE yes normal 4 CHAMBERED HEART OUTFLOW TRACTS DIAPHRAGM yes normal STOMACH yes normal RENAL REGION yes normal BLADDER yes normal CORD INSERTION 3 VESSEL CORD SPINE ARMS/HANDS LEGS/FEET GENITALIA Pt doesn't want to know SUSPECTED ABNORMALITIES: no QUALITY OF SCAN: Limited view because of pt body habitus TECHNICIAN COMMENTS: [place] 33+3wks,efw 2190g 44.8%,cephalic,BPP 8/8,RI .68,.61,ant pl gr 2,bilat adnexa wnl,afi 11.5cm A copy of this report including all images has been saved and backed up to a second source for retrieval if needed. All measures and details of the anatomical scan, placentation, fluid volume and pelvic anatomy are contained in that report. Amber Flora Lipps 09/18/2014 2:30 PM Clinical Impression and recommendations: I have reviewed the sonogram results above. Combined with the patient's current clinical course, below are my impressions and any appropriate recommendations for management based on the sonographic findings: 1. Symmetric fetal growth at 44%ile, normal for gestational age. 2 specifically reassuring fetal assessment with good BPP, fluid and excellent Doppler flow 3 continue biweekly testing. Growth scan in 3 wk. FERGUSON,JOHN V   [place] Ua Cord Doppler  10/05/2014 FOLLOW UP SONOGRAM Heather Huff is in the office for a follow up sonogram for EFW,BPP,cord dopplers. She is a 24 y.o. year old G1P0 with Estimated Date of Delivery: 11/03/14 by early ultrasound now at [redacted]w[redacted]d weeks gestation. Thus far the pregnancy has been complicated by CHTN,late pnc.Marland Kitchen GESTATION: SINGLETON PRESENTATION:  cephalic FETAL ACTIVITY: Heart rate 135 The fetus is active. AMNIOTIC FLUID: The amniotic fluid volume is normal, 14.6 cm. PLACENTA LOCALIZATION: anterior GRADE 2 CERVIX: Limited view ADNEXA: wnl GESTATIONAL AGE AND BIOMETRICS: Gestational criteria: Estimated Date of Delivery: 11/03/14 by early ultrasound now at [redacted]w[redacted]d Previous Scans:6 BIPARIETAL DIAMETER 8.74 cm 35+2 weeks HEAD CIRCUMFERENCE  32.32 cm 36+4 weeks ABDOMINAL CIRCUMFERENCE 30.70 cm 34+5 weeks FEMUR LENGTH 6.95 cm 35+5 weeks  AVERAGE EGA(BY THIS SCAN): 35+4 weeks ESTIMATED FETAL WEIGHT: 2623 grams, 34.6 % BIOPHYSCIAL PROFILE:  COMMENTS GROSS BODY MOVEMENT 2 TONE 2 RESPIRATIONS 2 AMNIOTIC FLUID 2 SCORE: 8/8 (Note: NST was not performed as part of this antepartum testing) DOPPLER FLOW STUDIES: UMBILICAL ARTERY RI RATIOS: 0.62, 0.59 ANATOMICAL SURVEY  COMMENTS CEREBRAL VENTRICLES CHOROID PLEXUS CEREBELLUM CISTERNA MAGNA NUCHAL REGION ORBITS NASAL BONE  NOSE/LIP FACIAL PROFILE yes normal 4 CHAMBERED HEART OUTFLOW TRACTS DIAPHRAGM yes normal STOMACH yes normal RENAL REGION yes normal BLADDER yes normal CORD INSERTION 3 VESSEL CORD SPINE ARMS/HANDS LEGS/FEET GENITALIA Pt doesn't want to know SUSPECTED ABNORMALITIES: no QUALITY OF SCAN: Limited view TECHNICIAN COMMENTS: [place] 35+6wks,cephalic,afi 14.6cm,ant pl gr 2 ,bilat  adnexa wnl,BPP  8/8,RI .62,.59,efw 2623g 34.6% A copy of this report including all images has been saved and backed up to a second source for retrieval if needed. All measures and details of the anatomical scan, placentation, fluid volume and pelvic anatomy are contained in that report. Karie Chimera 10/05/2014 11:27 AM   [place] Ua Cord Doppler  09/26/2014 FOLLOW UP SONOGRAM Heather Huff is in the office for a follow up sonogram for BPP,EFW, and cord doppler. She is a 24 y.o. year old G1P0 with Estimated Date of Delivery: 11/03/14 by early ultrasound now at [redacted]w[redacted]d weeks gestation. Thus far the pregnancy has been complicated by [facility] and late [facility].Marland Kitchen GESTATION: SINGLETON PRESENTATION: cephalic FETAL ACTIVITY: Heart rate 151bpm The fetus is active. AMNIOTIC FLUID: The amniotic fluid volume is normal, 11.5 cm. PLACENTA LOCALIZATION: anterior GRADE 2 CERVIX: Limited view ADNEXA: The ovaries are normal. GESTATIONAL AGE AND BIOMETRICS: Gestational criteria: Estimated Date of Delivery: 11/03/14 by LMP now at [redacted]w[redacted]d Previous Scans:5 BIOPHYSCIAL PROFILE:  COMMENTS GROSS BODY MOVEMENT 2 TONE  2 RESPIRATIONS 2 AMNIOTIC FLUID 2  SCORE: 8/8 (Note: NST was not performed as part of this antepartum testing) DOPPLER FLOW STUDIES: UMBILICAL ARTERY RI RATIOS: 0.68, 0.61  BIPARIETAL DIAMETER 8.29 cm 33+2 weeks HEAD CIRCUMFERENCE 30.78 cm 34+2 weeks ABDOMINAL CIRCUMFERENCE  29 cm 33 weeks FEMUR LENGTH 6.49 cm 33+3 weeks  AVERAGE EGA(BY THIS SCAN): 33+4 weeks  ESTIMATED FETAL  WEIGHT: 2190 grams, 44.8 % ANATOMICAL SURVEY COMMENTS CEREBRAL VENTRICLES yes normal CHOROID PLEXUS yes normal CEREBELLUM CISTERNA MAGNA NUCHAL REGION ORBITS NASAL BONE NOSE/LIP FACIAL PROFILE yes normal 4 CHAMBERED HEART OUTFLOW TRACTS DIAPHRAGM yes normal STOMACH yes normal RENAL REGION yes normal BLADDER yes normal CORD INSERTION 3 VESSEL CORD SPINE ARMS/HANDS LEGS/FEET GENITALIA Pt doesn't want to know SUSPECTED ABNORMALITIES: no QUALITY OF SCAN: Limited view because of pt body habitus TECHNICIAN COMMENTS: [place] 33+3wks,efw 2190g 44.8%,cephalic,BPP 8/8,RI .68,.61,ant pl gr 2,bilat adnexa wnl,afi 11.5cm A copy of this report including all images has been saved and backed up to a second source for retrieval if needed. All measures and details of the anatomical scan, placentation, fluid volume and pelvic anatomy are contained in that report. Amber Flora Lipps 09/18/2014 2:30 PM Clinical Impression and recommendations: I have reviewed the sonogram results above. Combined with the patient's current clinical course, below are my impressions and any appropriate recommendations for management based on the sonographic findings: 1. Symmetric fetal growth at 44%ile, normal for gestational age. 2 specifically reassuring fetal assessment with good BPP, fluid and excellent Doppler flow 3 continue biweekly testing. Growth scan in 3 wk. FERGUSON,JOHN V     MAU Course:   Assessment: Pregnancy 35 w 6 days chronic HTN with superimposed preeclampsia, with severe features  Plan: IV labetalol X 2 doses, then begin Mag sulfate, admit to L& D for cytotec cervical ripening and subsequent foley bulb. Continue labetalol oral while cervical ripening.     Medication List    ASK your doctor about these medications       albuterol 108 (90  BASE) MCG/ACT inhaler  Commonly known as: PROVENTIL HFA;VENTOLIN HFA  Inhale 1 puff into the lungs every 6 (six) hours as needed for wheezing or shortness of breath.     labetalol 200 MG tablet  Commonly known as: NORMODYNE  Take 1 tablet (200 mg total) by mouth 2 (two) times daily.     PRENATAL PLUS IRON 29-1 MG Tabs  Take 1 daily        Jonny Ruiz  Benancio Deeds, MD 10/05/2014 10:55 PM       I did not admit this patient.  Dr. Christin Bach admitted this patient.  Note as above. Lesly Dukes., MD

## 2014-10-11 NOTE — Progress Notes (Signed)
UR chart review completed.  

## 2014-10-11 NOTE — Progress Notes (Signed)
Was notified in safety rounds that patient began induction 5 days ago and delivered last night.  Her son had to be coded for an extended period of time and is presently in the NICU.  Introduced myself and chaplaincy services to patient, who appeared concerned to have a chaplain visiting her.  Explained that chaplain services are for all patients and that I would also be visiting her son, Adonis, in the NICU. Offered supportive presence, but patient was guarded and resistant to talking about any feelings.  When asked if she had a good support system at home, she stated that her mother was a good support and that she was on her way up.  Informed patient how to get in contact with chaplain.  Will continue to follow.  Adaline Sill, M.Div, [facility], LCAS-A Chaplain    10/11/14 1610  Clinical Encounter Type  Visited With Patient  Visit Type Initial  Referral From Nurse  Consult/Referral To Chaplain  Spiritual Encounters  Spiritual Needs Emotional  Stress Factors  Patient Stress Factors Loss of control;Major life changes  Family Stress Factors Health changes

## 2014-10-12 MED ORDER — AMLODIPINE BESYLATE 5 MG PO TABS: 5.0000 mg | ORAL_TABLET | Freq: Every day | ORAL | Status: DC

## 2014-10-12 MED ORDER — LABETALOL HCL 5 MG/ML IV SOLN
20.0000 mg | INTRAVENOUS | Status: AC | PRN
  Administered 2014-10-12 – 2014-10-13 (×2): 20 mg via INTRAVENOUS
  Filled 2014-10-12: qty 4

## 2014-10-12 MED ORDER — PNEUMOCOCCAL VAC POLYVALENT 25 MCG/0.5ML IJ INJ
0.5000 mL | INJECTION | INTRAMUSCULAR | Status: DC
  Filled 2014-10-12: qty 0.5

## 2014-10-12 MED ORDER — AMLODIPINE BESYLATE 10 MG PO TABS
10.0000 mg | ORAL_TABLET | Freq: Every day | ORAL | Status: DC
  Filled 2014-10-12: qty 1

## 2014-10-12 MED ORDER — LABETALOL HCL 5 MG/ML IV SOLN
INTRAVENOUS | Status: AC
  Filled 2014-10-12: qty 4

## 2014-10-12 MED ORDER — ENALAPRIL MALEATE 20 MG PO TABS
20.0000 mg | ORAL_TABLET | Freq: Every day | ORAL | Status: DC
  Administered 2014-10-12 – 2014-10-13 (×2): 20 mg via ORAL
  Filled 2014-10-12 (×3): qty 1

## 2014-10-12 NOTE — Progress Notes (Signed)
Post Partum Day 1 Subjective: up ad lib, voiding and tolerating PO  Objective: Blood pressure 134/85, pulse 74, temperature 97.9 F (36.6 C), temperature source Axillary, resp. rate 18, height  (1.651 m), weight 274 lb 3.2 oz (124.376 kg), last menstrual period 01/27/2014, SpO2 100 %, unknown if currently breastfeeding.  Physical Exam:  General: alert, cooperative and no distress Lochia: appropriate Uterine Fundus: firm DVT Evaluation: No evidence of DVT seen on physical exam. Negative Homan's sign.   Recent Labs  10/10/14 0813 10/10/14 1654  HGB 11.3* 11.4*  HCT 34.0* 33.8*    Assessment/Plan: Plan for discharge tomorrow and Breastfeeding  Lactation support Discontinue magnesium Change labetalol to enalapril   LOS: 6 days   STINSON, JACOB JEHIEL 10/12/2014, 7:00 AM

## 2014-10-12 NOTE — Progress Notes (Signed)
2141 Back from NICU with family.  BP=177/93, denies any H/A, blurring of vision nor epigastric pain.  DTR=1+  Clonus= neg.   Voided 800 cc clear, pinkish urine.  Will recheck after patient rested

## 2014-10-12 NOTE — Anesthesia Postprocedure Evaluation (Signed)
Anesthesia Post Note  Patient: Heather Huff  Procedure(s) Performed: * No procedures listed *  Anesthesia type: Epidural  Patient location: Mother/Baby  Post pain: Pain level controlled  Post assessment: Post-op Vital signs reviewed  Last Vitals:  Filed Vitals:   10/12/14 1238  BP: 158/95  Pulse: 84  Temp: 36.8 C  Resp: 20    Post vital signs: Reviewed  Level of consciousness:alert  Complications: No apparent anesthesia complications

## 2014-10-12 NOTE — Clinical Social Work Maternal (Signed)
CLINICAL SOCIAL WORK MATERNAL/CHILD NOTE  Patient Details  Name: Heather Huff MRN: [id number] Date of Birth: [birth year]  Date:  10/12/2014  Clinical Social Worker Initiating Note:  Irie Dowson E. Brigitte Pulse, [place] Date/ Time Initiated:  10/12/14/1545     Child's Name:  Heather Huff   Legal Guardian:  Mother (FOB/Tre'Lon Eulas Post is in [employer] and currently stationed in [place].)   Need for Interpreter:  None   Date of Referral:        Reason for Referral:   (No referral-NICU admission)   Referral Source:      Address:  [address], [place], [place] [address]  Phone number:  [phone]   Household Members:  Parents, Siblings, Relatives (MOB states she lives with her mother, grandmother and 66 year old brother.)   Natural Supports (not living in the home):  Immediate Family   Professional Supports:     Employment:     Type of Work:     Education:      Pensions consultant:  [employer]   Other Resources:   (MOB plans to apply for [employer] provided contact number for office in [facility].)   Cultural/Religious Considerations Which May Impact Care: None stated  Strengths:  Ability to meet basic needs , Compliance with medical plan , Home prepared for child , Other (Comment), Understanding of illness (MOB states she has not yet chosen a pediatrician-she is now aware that a list is available at the NICU desk if needed.  She states she has all supplies except a car seat that her family plans to purchase soon.)   Risk Factors/Current Problems:  None   Cognitive State:  Alert , Linear Thinking    Mood/Affect:  Tearful , Interested , Calm    CSW Assessment: CSW met with MOB at baby's bedside to introduce services, offer support and complete assessment due to NICU admission at 36.5 weeks.  MOB immediately began to cry.  She states she would like to talk with CSW at this time and stated that staying at the bedside is preferable.  MOB states "it is hard to see him with all the cords."   CSW validated feelings and asked if she understands what each cord does for baby.  She states she has been told.  CSW asked her to consider the necessity of each cord and what it is doing for baby to help him reach discharge goals.  CSW also encouraged MOB to look past the cords/environment and focus on her baby.  CSW suggests screens and headphones if needed. MOB informed CSW that she feels like baby's NICU admission is her fault.  CSW asked her if she can identify a reason she should feel this way.  She told CSW that her blood pressure was high.  CSW normalized this by telling MOB how often CSW meets with a mother of a NICU baby who had to deliver because of high blood pressure.  CSW also normalized her feelings of guilt, but informed her that unless she is using her guilt as motivation to change a behavior, CSW encouraged her to allow herself to let go of this emotion.  MOB states she does not identify a behavior to change.  CSW reinforced this as a reason to let go of guilt.  CSW discussed common emotions often experienced in the first two weeks after delivery as well as signs and symptoms of PPD to watch for.  CSW stated the importance of talking with CSW and or her doctor if she has  concerns about her emotions at any time.  She agreed. MOB reports having everything she needs for baby at home except a car seat, which her family plans to purchase soon.  CSW reviewed safe sleep/SIDS precautions and MOB states she is aware of this.  She has not yet chosen a pediatrician, but understands that she needs to prior to baby's discharge.  She plans to call [facility] today.   CSW offered gas cards to [facility] since she will be traveling from [place] to see baby after her discharge.  MOB accepted and was appreciative.  CSW provided her with one $10 card at this time, as that is all CSW has access to at this time.  CSW will provide another card next week when more are obtained.   CSW explained ongoing support services offered by  NICU CSW and provided contact information.  MOB was calm and had stopped crying.  CSW encouraged her to rest, as sleep deprivation increases emotions.  CSW encouraged her to allow herself to be emotional as her labor, delivery, and postpartum hospitalization did not go according to her expectations.  MOB seemed appreciative of the visit and knows that she can call CSW any time.  CSW Plan/Description:  Engineer, mining , Psychosocial Support and Ongoing Assessment of Needs, Information/Referral to [place], [place], [place] 10/12/2014, 4:26 PM

## 2014-10-13 ENCOUNTER — Encounter (HOSPITAL_COMMUNITY): Payer: Self-pay | Admitting: Advanced Practice Midwife

## 2014-10-13 MED ORDER — AMLODIPINE BESYLATE 10 MG PO TABS: 10.0000 mg | ORAL_TABLET | Freq: Every day | ORAL | Status: DC

## 2014-10-13 MED ORDER — ENALAPRIL MALEATE 20 MG PO TABS: 20.0000 mg | ORAL_TABLET | Freq: Every day | ORAL | Status: DC

## 2014-10-13 NOTE — Progress Notes (Signed)
Pt d/c home with family, stable condition.

## 2014-10-13 NOTE — Discharge Instructions (Signed)
Breast Pumping Tips °If you are breastfeeding, there may be times when you cannot feed your baby directly. Returning to work or going on a trip are examples. Pumping allows you to store breast milk and feed it to your baby later.  °You may not get much milk when you first start to pump. Your breasts should start to make more after a few days. If you pump at the times you usually feed your baby, you may be able to keep making enough milk to feed your baby without also using formula. The more often you pump, the more milk your body will make. °WHEN SHOULD I PUMP?  °· You can start to pump soon after you have your baby. Ask your doctor what is right for you and your baby. °· If you are going back to work, start pumping a few weeks before. This gives you time to learn how to pump and to store a supply of milk. °· When you are with your baby, feed your baby when he or she is hungry. Pump after each feeding. °· When you are away from your baby for many hours, pump for about 15 minutes every 2-3 hours. Pump both breasts at the same time if you can. °· If your baby has a formula feeding, make sure to pump close to the same time. °· If you drink any alcohol, wait 2 hours before pumping. °HOW DO I GET READY TO PUMP? °Your let-down reflex is your body's natural reaction that makes your breast milk flow. It is easier to make your breast milk flow when you are relaxed. Try these things to help you relax: °· Smell one of your baby's blankets or an item of clothing. °· Look at a picture or video of your baby. °· Sit in a quiet, private space. °· Massage the breast you plan to pump. °· Place soothing warmth on the breast. °· Play relaxing music. °WHAT ARE SOME BREAST PUMPING TIPS? °· Wash your hands before you pump. You do not need to wash your nipples or breasts. °· There are three ways to pump. You can: °¨ Use your hand to massage and squeeze your breast. °¨ Use a handheld manual pump. °¨ Use an electric pump. °· Make sure the  suction cup on the breast pump is the right size. Place the suction cup directly over the nipple. It can be painful or hurt your nipple if it is the wrong size or placed wrong. °· Put a small amount of purified or modified lanolin on your nipple and areola if you are sore. °· If you are using an electric pump, change the speed and suction power to be more comfortable. °· You may need a different type of pump if pumping hurts or you do not get a lot of milk. Your doctor can help you pick what type of pump to use. °· Keep a full water bottle near you always. Drinking lots of fluid helps you make more milk. °· You can store your milk to use later. Pumped breast milk can be stored in a sealable, sterile container or plastic bag. Always put the date you pumped it on the container. °¨ Milk can stay out at room temperature for up to 8 hours. °¨ You can store your milk in the refrigerator for up to 8 days. °¨ You can store your milk in the freezer for 3 months. Thaw frozen milk using warm water. Do not put it in the microwave. °· Do not smoke.   Ask your doctor for help. °WHEN SHOULD I CALL MY DOCTOR? °· You have a hard time pumping. °· You are worried you do not make enough milk. °· You have nipple pain, soreness, or redness. °· You want to take birth control pills. °Document Released: 07/15/2007 Document Revised: 01/31/2013 Document Reviewed: 11/18/2012 °ExitCare® Patient Information ©2015 [employer]. This information is not intended to replace advice given to you by your health care provider. Make sure you discuss any questions you have with your health care provider. ° °

## 2014-10-13 NOTE — Discharge Summary (Signed)
OB Discharge Summary  Patient Name: Heather Huff DOB: [birth year] MRN: [id number]  Date of admission: 10/05/2014 Delivering MD: Cam Hai D   Date of discharge:10/13/2014  Admitting diagnosis: Attention was superimposed preeclampsia, severe criteria   Intrauterine pregnancy: [redacted]w[redacted]d     Secondary diagnosis: Chronic Hypertension with Superimposed Preeclampsia     Discharge diagnosis: Preterm Pregnancy Delivered and CHTN with superimposed preeclampsia                                                                                            Post partum procedures:  Augmentation: AROM, Pitocin, Cytotec and Foley Balloon  Complications:None  Hospital course:  Induction of Labor With Vaginal Delivery   24 y.o. yo G1P0101 at [redacted]w[redacted]d was admitted to the hospital 10/05/2014 for induction of labor.  Indication for induction: Preeclampsia and Chronic hypertension.  Patient had a complicated labor course as follows: Patient was admitted on Saturday morning with chronic hypertension severe features with blood pressures in the 180s systolic requiring magnesium sulfate and repeated doses of a press Lane and IV labetalol as well as oral labetalol cervix was unfavorable at 36 weeks 5 days, and required a prolonged ripening process using Cytotec. Laboratory evaluation was negative for any signs and symptoms of help syndrome. Vaginal cervical change occurred and Foley bulb cervical ripening followed. Labor also remain slow on Pitocin. She delivered Thursday morning 6 AM, after a 5 day induction process. Fetal heart tracing showed a relatively reassuring tracing with variables noted with pushing during second stage but good recovery and beat-to-beat variability remained present. The baby was depressed at delivery with Apgars of 1 and 4 assigned at 5 and 10 minutes respectively and neonatal ICU care with intubation required cord blood pH was 7.2 at delivery Mediations and procedures used include: Foley Bulb,  Cytotec, Pitocin and AROM Intrapartum Procedures: Episiotomy: None [1]                                         Lacerations:     Patient had delivery of a Viable infant.  Information for the patient's newborn:  Patches, Mcdonnell [[id number]]  Delivery Method: Vaginal, Spontaneous Delivery (Filed from Delivery Summary)   10/11/2014  Details of delivery can be found in separate delivery note.  Patient had a routine postpartum course except that blood pressures remained a problem. She had 24 hours of magnesium sulfate continued postpartum she was converted to enalapril 20 mg postpartum and continued to require when necessary doses of labetalol due to blood pressures in the severe range at time of discharge on Saturday, 10/13/2014, the patient has Norvasc 10 mg added to blood pressure regimen and will be [facility]. She will be breast-feeding, and contraception issues are not yet addressed. Patient is discharged home No discharge date for patient encounter.    Physical exam  Filed Vitals:   10/13/14 0152 10/13/14 0550 10/13/14 0611 10/13/14 0650  BP: 140/84 165/95  152/91  Pulse: 76 72  73  Temp: 98.7 F (37.1 C) 99.8 F (37.7 C)    TempSrc: Oral Oral    Resp: 18 20  18   Height:      Weight:   268 lb 4 oz (121.677 kg)   SpO2: 100% 100%     General: alert, cooperative and no distress Lochia: appropriate Uterine Fundus: firm Incision: N/A DVT Evaluation: No evidence of DVT seen on physical exam. Labs: Lab Results  Component Value Date   WBC 15.0* 10/10/2014   HGB 11.4* 10/10/2014   HCT 33.8* 10/10/2014   MCV 75.6* 10/10/2014   PLT 149* 10/10/2014   CMP Latest Ref Rng 10/05/2014  Glucose 65 - 99 mg/dL 69  BUN 6 - 20 mg/dL 7  Creatinine 1.61 - 0.96 mg/dL 0.45  Sodium 409 - 811 mmol/L 137  Potassium 3.5 - 5.1 mmol/L 3.9  Chloride 101 - 111 mmol/L 108  CO2 22 - 32 mmol/L 19(L)  Calcium 8.9 - 10.3 mg/dL 9.1(Y)  Total Protein 6.5 - 8.1  g/dL 6.5  Albumin 3.5 - 5.5 g/dL -  Total Bilirubin 0.3 - 1.2 mg/dL 0.6  Alkaline Phos 38 - 126 U/L 160(H)  AST 15 - 41 U/L 20  ALT 14 - 54 U/L 16    Discharge instruction: per After Visit Summary and "Baby and Me Booklet".  Medications:   Medication List    STOP taking these medications        labetalol 200 MG tablet  Commonly known as:  NORMODYNE      TAKE these medications        albuterol 108 (90 BASE) MCG/ACT inhaler  Commonly known as:  PROVENTIL HFA;VENTOLIN HFA  Inhale 1 puff into the lungs every 6 (six) hours as needed for wheezing or shortness of breath.     amLODipine 10 MG tablet  Commonly known as:  NORVASC  Take 1 tablet (10 mg total) by mouth daily.     enalapril 20 MG tablet  Commonly known as:  VASOTEC  Take 1 tablet (20 mg total) by mouth daily.     PRENATAL PLUS IRON 29-1 MG Tabs  Take 1 daily        Diet: routine diet  Activity: Advance as tolerated. Pelvic rest for 6 weeks.   Outpatient follow up: One week then 4 weeks  Postpartum contraception: Undecided  Newborn Data: Live born female  Birth Weight: 5 lb 2.9 oz (2350 g) APGAR: , 1  Baby Feeding: Breast Disposition:NICU   10/13/2014 Tilda Burrow, MD

## 2014-10-13 NOTE — Progress Notes (Signed)
D/c instructions and medications reviewed with patient. Pt will call for her f/u appt [facility] wk. Hand pump and instructions given to patient. Pt waiting for family to arrive so will visit infant in NICU while she waits.

## 2014-10-22 ENCOUNTER — Encounter: Payer: Self-pay | Admitting: Obstetrics and Gynecology

## 2014-10-22 ENCOUNTER — Ambulatory Visit (INDEPENDENT_AMBULATORY_CARE_PROVIDER_SITE_OTHER): Payer: BLUE CROSS/BLUE SHIELD | Admitting: Obstetrics and Gynecology

## 2014-10-22 VITALS — BP 110/76 | Ht 65.0 in | Wt 238.0 lb

## 2014-10-22 DIAGNOSIS — R309 Painful micturition, unspecified: Secondary | ICD-10-CM

## 2014-10-22 LAB — POCT URINALYSIS DIPSTICK
Blood, UA: 4
GLUCOSE UA: NEGATIVE
KETONES UA: NEGATIVE
Nitrite, UA: POSITIVE

## 2014-10-22 MED ORDER — NITROFURANTOIN MONOHYD MACRO 100 MG PO CAPS: 100.0000 mg | ORAL_CAPSULE | Freq: Two times a day (BID) | ORAL | Status: DC

## 2014-10-22 MED ORDER — SULFAMETHOXAZOLE-TRIMETHOPRIM 800-160 MG PO TABS: 1.0000 | ORAL_TABLET | Freq: Two times a day (BID) | ORAL | Status: DC

## 2014-10-22 NOTE — Progress Notes (Signed)
Patient ID: Heather Huff, female   DOB: 12/19/1990, 24 y.o.   MRN: [id number] Pt here today for BP check. Pt states that her lower back hurts. Pt states that she has pressure when she pees as well, pt denies any burning with urination.

## 2014-10-22 NOTE — Progress Notes (Addendum)
Patient ID: Shilah Hefel, female   DOB: February 05, 1991, 24 y.o.   MRN: [id number]    [facility] Clinic Visit  Patient name: Heather Huff MRN [id number]  Date of birth: [birth year]  CC & HPI:  Heather Huff is a 24 y.o. female presenting today for a blood pressure check. She is currently taking amlodipine-10 mg and enalapril-20 mg. Pt did not take any blood pressure medication prior to pregnancy. Pt is breast-feeding and bottle-feeding. She denies light-headedness or dizziness.  She doesn't seem to remember details of hospital course, how many days of IOL, how many days baby was in NICU, etc.    Pt also reports dysuria, lower back pain and pressure while urinating.   ROS:  A complete 10 system review of systems was obtained and all systems are negative except as noted in the HPI and PMH.   Pertinent History Reviewed:   Reviewed: Significant for HTN Medical         Past Medical History  Diagnosis Date  . Allergy     seasonal  . Pregnant 04/17/2014  . Nausea and vomiting during pregnancy 04/17/2014  . Asthma     uses inhaler prn  . Hypertension                               Surgical Hx:   History reviewed. No pertinent past surgical history. Medications: Reviewed & Updated - see associated section                       Current outpatient prescriptions:  .  albuterol (PROVENTIL HFA;VENTOLIN HFA) 108 (90 BASE) MCG/ACT inhaler, Inhale 1 puff into the lungs every 6 (six) hours as needed for wheezing or shortness of breath. , Disp: , Rfl:  .  amLODipine (NORVASC) 10 MG tablet, Take 1 tablet (10 mg total) by mouth daily., Disp: 30 tablet, Rfl: 1 .  enalapril (VASOTEC) 20 MG tablet, Take 1 tablet (20 mg total) by mouth daily., Disp: 30 tablet, Rfl: 6 .  Prenatal Vit-Iron Carbonyl-FA (PRENATAL PLUS IRON) 29-1 MG TABS, Take 1 daily (Patient taking differently: Take 1 tablet by mouth daily. ), Disp: 30 tablet, Rfl: 11   Social History: Reviewed -  reports that she has never smoked. She has  never used smokeless tobacco.  Objective Findings:  Vitals: Blood pressure 110/76, height  (1.651 m), weight 238 lb (107.956 kg), last menstrual period 01/27/2014, currently breastfeeding.  Physical Examination: General appearance - alert, well appearing, and in no distress and oriented to person, place, and time Mental status - alert, oriented to person, place, and time, normal mood, behavior, speech, dress, motor activity, and thought processes   Assessment & Plan:   A:  1. Normotensive s/p chtn , will reduce b p meds. 2. UTI 3 backache due to increased activity of carrying baby P:  1. Will stop amlodipine, but continue on enalapril  2. Septra ds BID  This chart was scribed for Tilda Burrow, MD by Gwenyth Ober, Medical Scribe. This patient was seen in office and the patient's care was started at 2:23 PM.   I personally performed the services described in this documentation, which was SCRIBED in my presence. The recorded information has been reviewed and considered accurate. It has been edited as necessary during review. Tilda Burrow, MD

## 2014-10-22 NOTE — Addendum Note (Signed)
Addended by: Richardson Chiquito on: 10/22/2014 04:39 PM   Modules accepted: Orders

## 2014-10-24 LAB — URINE CULTURE

## 2014-11-12 ENCOUNTER — Encounter: Payer: Self-pay | Admitting: Women's Health

## 2014-11-12 ENCOUNTER — Ambulatory Visit (INDEPENDENT_AMBULATORY_CARE_PROVIDER_SITE_OTHER): Payer: BLUE CROSS/BLUE SHIELD | Admitting: Women's Health

## 2014-11-12 MED ORDER — NORETHINDRONE 0.35 MG PO TABS: ORAL_TABLET | ORAL | Status: DC

## 2014-11-12 NOTE — Patient Instructions (Signed)
Increase vasotec (enalapril ) to  daily (2 tablets daily)  Constipation  Drink plenty of fluid, preferably water, throughout the day  Eat foods high in fiber such as fruits, vegetables, and grains  Exercise, such as walking, is a good way to keep your bowels regular  Drink warm fluids, especially warm prune juice, or decaf coffee  Eat a 1/2 cup of real oatmeal (not instant), 1/2 cup applesauce, and 1/2-1 cup warm prune juice every day  If needed, you may take Colace (docusate sodium) stool softener once or twice a day to help keep the stool soft. If you are pregnant, wait until you are out of your first trimester (12-14 weeks of pregnancy)  If you still are having problems with constipation, you may take Miralax once daily as needed to help keep your bowels regular.  If you are pregnant, wait until you are out of your first trimester (12-14 weeks of pregnancy)

## 2014-11-12 NOTE — Progress Notes (Addendum)
Patient ID: Heather Huff, female   DOB: 02/24/1990, 24 y.o.   MRN: [id number] Subjective:    Heather Huff is a 24 y.o. G78P0101 African American female who presents for a postpartum visit. She is 4 weeks postpartum following a spontaneous vaginal delivery at 36.5 gestational weeks after IOL d/t CHTN w/ superimposed severe pre-e. Anesthesia: epidural. I have fully reviewed the prenatal and intrapartum course. Postpartum course has been uncomplicated. Baby's course has been complicated by 11d NICU stay d/t respiratory distress at birth- apgars 1/4- intubated & chest compressions- doing well since d/c from NICU and pt reports no problems. Baby is feeding by breast/bottle. Bleeding no bleeding. Bowel function is constipation. Bladder function is normal. Patient is not sexually active. Last sexual activity: prior to birth of baby. Contraception method is none and still contemplating nexplanon vs. pop's. . Is moving to [place] w/ husband who is in Hotel manager at the end of the month. Postpartum depression screening: negative. Score 6.  Last pap 05/22/14 and was ASCUS w/ +HRHPV, was unable to do colpo d/t long engorged vagina- recommended repeat attempt 8wks pp.  The following portions of the patient's history were reviewed and updated as appropriate: allergies, current medications, past medical history, past surgical history and problem list.  Review of Systems Pertinent items are noted in HPI.   Filed Vitals:   11/12/14 1428  BP: 142/96  Pulse: 88  Weight: 246 lb (111.585 kg)   No LMP recorded.  Objective:   General:  alert, cooperative and no distress   Breasts:  deferred, no complaints  Lungs: clear to auscultation bilaterally  Heart:  regular rate and rhythm  Abdomen: soft, nontender   Vulva: normal  Vagina: normal vagina  Cervix:  closed  Corpus: Well-involuted  Adnexa:  Non-palpable  Rectal Exam: No hemorrhoids        Assessment:   Postpartum exam 4 wks s/p SVB @ 36wks after IOL d/t CHTN  w/ SI pre-e Breast & bottlefeeding Abnormal pap- needs colpo CHTN Depression screening Contraception counseling   Plan:   Contraception: rx micronor w/ 11RF- set alarm to remind to take at exact same time daily  Still thinking about nexplanon- w/ her insurance would need to order it- may not be in by time she goes to [place] Discussed by w/ [facility]- to increased enalapril to  daily Follow up in: 2 weeks for bp f/u and colpo w/ [facility] or earlier if needed  Marge Duncans CNM, [facility] 11/12/2014 2:47 PM

## 2014-11-19 ENCOUNTER — Ambulatory Visit: Payer: BLUE CROSS/BLUE SHIELD | Admitting: Obstetrics and Gynecology

## 2014-11-26 ENCOUNTER — Ambulatory Visit (INDEPENDENT_AMBULATORY_CARE_PROVIDER_SITE_OTHER): Payer: BLUE CROSS/BLUE SHIELD | Admitting: Obstetrics and Gynecology

## 2014-11-26 ENCOUNTER — Encounter: Payer: Self-pay | Admitting: Obstetrics and Gynecology

## 2014-11-26 VITALS — BP 130/92 | Ht 65.0 in | Wt 250.0 lb

## 2014-11-26 DIAGNOSIS — R87810 Cervical high risk human papillomavirus (HPV) DNA test positive: Secondary | ICD-10-CM | POA: Diagnosis not present

## 2014-11-26 DIAGNOSIS — R87610 Atypical squamous cells of undetermined significance on cytologic smear of cervix (ASC-US): Secondary | ICD-10-CM | POA: Diagnosis not present

## 2014-11-26 DIAGNOSIS — IMO0002 Reserved for concepts with insufficient information to code with codable children: Secondary | ICD-10-CM

## 2014-11-26 DIAGNOSIS — Z3200 Encounter for pregnancy test, result unknown: Secondary | ICD-10-CM | POA: Diagnosis not present

## 2014-11-26 LAB — POCT URINE PREGNANCY: PREG TEST UR: NEGATIVE

## 2014-11-26 NOTE — Progress Notes (Signed)
Patient ID: Heather Huff, female   DOB: 1990/12/06, 24 y.o.   MRN: [id number] Pt here today for Colposcopy. Pt's UPT today is negative.

## 2014-11-26 NOTE — Progress Notes (Signed)
Patient ID: Heather Huff, female   DOB: [birth year], 24 y.o.   MRN: [id number]  Heather MinorsChristian Huff 24 y.o. [id number] here for colposcopy for ASCUS with POSITIVE high risk HPV pap smear on 10/16. LMP was recent, lite. Last week.  Discussed role for HPV in cervical dysplasia, need for surveillance.  Patient given informed consent, signed copy in the chart, time out was performed.  Placed in lithotomy position. Cervix viewed with speculum and colposcope after application of acetic acid.  Pt severely constipated difficult exam with largest speculum Colposcopy adequate? Yes  no visible lesions, no mosaicism, no punctation and no abnormal vasculature; biopsies obtained at none.   ECC specimen obtained.none, clearly visualized endocervix   Colposcopy IMPRESSION:no visible dysplasia.  Patient was given post procedure instructions. Will follow up pathology and manage accordingly.  Routine preventative health maintenance measures emphasized.   Pt needs appt for NExplanon

## 2014-12-10 ENCOUNTER — Encounter: Payer: BLUE CROSS/BLUE SHIELD | Admitting: Women's Health

## 2014-12-18 ENCOUNTER — Encounter: Payer: BLUE CROSS/BLUE SHIELD | Admitting: Women's Health

## 2014-12-18 ENCOUNTER — Encounter: Payer: Self-pay | Admitting: Women's Health

## 2019-12-14 ENCOUNTER — Ambulatory Visit
Admission: EM | Admit: 2019-12-14 | Discharge: 2019-12-14 | Disposition: A | Discharge status: Home / Self Care | Payer: BC Managed Care – PPO | Attending: Emergency Medicine | Admitting: Emergency Medicine

## 2019-12-14 ENCOUNTER — Other Ambulatory Visit: Payer: Self-pay

## 2019-12-14 DIAGNOSIS — L500 Allergic urticaria: Secondary | ICD-10-CM

## 2019-12-14 DIAGNOSIS — B373 Candidiasis of vulva and vagina: Secondary | ICD-10-CM

## 2019-12-14 DIAGNOSIS — B3731 Acute candidiasis of vulva and vagina: Secondary | ICD-10-CM

## 2019-12-14 MED ORDER — FLUCONAZOLE 150 MG PO TABS: 150.0000 mg | ORAL_TABLET | Freq: Every day | ORAL | 0 refills | Status: DC

## 2019-12-14 MED ORDER — HYDROXYZINE HCL 25 MG PO TABS: 25.0000 mg | ORAL_TABLET | Freq: Four times a day (QID) | ORAL | 0 refills | Status: AC

## 2019-12-14 MED ORDER — HYDROXYZINE HCL 25 MG PO TABS: 25.0000 mg | ORAL_TABLET | Freq: Four times a day (QID) | ORAL | 0 refills | Status: DC

## 2019-12-14 NOTE — ED Provider Notes (Addendum)
[facility]    CSN: [id number] Arrival date & time: 12/14/19  1810      History   Chief Complaint Chief Complaint  Patient presents with  . Rash    since saturday    HPI Heather Huff is a 29 y.o. female  Presenting for pruritic, raised rash that has been occurring the last few nights.  States it improves throughout the day.  Has not taken thing for this.  Denies change in diet, lifestyle, medications.  No topical exposures, bug bites, fever, arthralgias, myalgias.  No notes at home has this.  Denies rash currently. Patient also requesting Diflucan for yeast infection.  Endorsing vaginal pruritus with thick, white discharge.  No pelvic pain, abdominal pain, urinary symptoms.  Past Medical History:  Diagnosis Date  . Allergy    seasonal  . Asthma    uses inhaler prn  . Hypertension   . Nausea and vomiting during pregnancy 04/17/2014  . Pregnant 04/17/2014  . Vaginal Pap smear, abnormal     Patient Active Problem List   Diagnosis Date Noted  . Severe pre-eclampsia in third trimester 10/06/2014  . Chronic hypertension during pregnancy, antepartum 08/29/2014  . ASCUS with positive high risk HPV 05/30/2014  . Asthma   . Nausea and vomiting during pregnancy 04/17/2014    History reviewed. No pertinent surgical history.  OB History    Gravida  1   Para  1   Term      Preterm  1   AB      Living  1     SAB      TAB      Ectopic      Multiple  0   Live Births  1            Home Medications    Prior to Admission medications   Medication Sig Start Date End Date Taking? Authorizing Provider  fluconazole (DIFLUCAN) 150 MG tablet Take 1 tablet (150 mg total) by mouth daily. May repeat in 72 hours if needed 12/14/19   Hall-Potvin, [place], PA-C  hydrOXYzine (ATARAX/VISTARIL) 25 MG tablet Take 1 tablet (25 mg total) by mouth every 6 (six) hours. 12/14/19   Hall-Potvin, [place], PA-C  albuterol (PROVENTIL HFA;VENTOLIN HFA) 108 (90 BASE)  MCG/ACT inhaler Inhale 1 puff into the lungs every 6 (six) hours as needed for wheezing or shortness of breath.   12/14/19  [provider]  enalapril (VASOTEC) 20 MG tablet Take 1 tablet (20 mg total) by mouth daily. 10/13/14 12/14/19  Tilda Burrow, MD  norethindrone (MICRONOR,CAMILA,ERRIN) 0.35 MG tablet 1 tablet by mouth at same time daily 11/12/14 12/14/19  Cheral Marker, CNM    Family History Family History  Problem Relation Age of Onset  . Hypertension Mother   . Thyroid disease Mother   . Asthma Sister   . Allergies Brother   . Arthritis Maternal Grandmother   . Hypertension Maternal Grandmother     Social History Social History   Tobacco Use  . Smoking status: Never Smoker  . Smokeless tobacco: Never Used  Vaping Use  . Vaping Use: Never used  Substance Use Topics  . Alcohol use: No    Comment: not now  . Drug use: No     Allergies   Patient has no known allergies.   Review of Systems Review of Systems  Constitutional: Negative for fatigue and fever.  HENT: Negative for ear pain, sinus pain, sore throat and voice change.  Eyes: Negative for pain, redness and visual disturbance.  Respiratory: Negative for cough and shortness of breath.   Cardiovascular: Negative for chest pain and palpitations.  Gastrointestinal: Negative for abdominal pain, diarrhea and vomiting.  Musculoskeletal: Negative for arthralgias and myalgias.  Skin: Positive for rash. Negative for wound.  Neurological: Negative for syncope and headaches.     Physical Exam Triage Vital Signs ED Triage Vitals  Enc Vitals Group     BP 12/14/19 1836 (!) 150/91     Pulse Rate 12/14/19 1836 76     Resp 12/14/19 1836 18     Temp 12/14/19 1836 98.3 F (36.8 C)     Temp Source 12/14/19 1836 Oral     SpO2 12/14/19 1836 99 %     Weight --      Height --      Head Circumference --      Peak Flow --      Pain Score 12/14/19 1838 0     Pain Loc --      Pain Edu? --      Excl. in GC?  --    No data found.  Updated Vital Signs BP (!) 150/91 (BP Location: Right Arm)   Pulse 76   Temp 98.3 F (36.8 C) (Oral)   Resp 18   LMP 11/22/2019 (Exact Date)   SpO2 99%   Visual Acuity Right Eye Distance:   Left Eye Distance:   Bilateral Distance:    Right Eye Near:   Left Eye Near:    Bilateral Near:     Physical Exam Constitutional:      General: She is not in acute distress. HENT:     Head: Normocephalic and atraumatic.  Eyes:     General: No scleral icterus.    Pupils: Pupils are equal, round, and reactive to light.  Cardiovascular:     Rate and Rhythm: Normal rate.  Pulmonary:     Effort: Pulmonary effort is normal.  Skin:    Coloration: Skin is not jaundiced or pale.     Findings: No rash.     Comments: Patient provides pictures: Consistent with allergic urticaria  Neurological:     Mental Status: She is alert and oriented to person, place, and time.      UC Treatments / Results  Labs (all labs ordered are listed, but only abnormal results are displayed) Labs Reviewed - No data to display  EKG   Radiology No results found.  Procedures Procedures (including critical care time)  Medications Ordered in UC Medications - No data to display  Initial Impression / Assessment and Plan / UC Course  I have reviewed the triage vital signs and the nursing notes.  Pertinent labs & imaging results that were available during my care of the patient were reviewed by me and considered in my medical decision making (see chart for details).     Patient with nocturnal allergic urticaria: Unknown trigger.  Will treat supportively as below, follow-up with allergy/immunology and/or PCP as needed.  Provided diflucan for yeast vaginitis.  Return precautions discussed, pt verbalized understanding and is agreeable to plan. Final Clinical Impressions(s) / UC Diagnoses   Final diagnoses:  Allergic urticaria  Yeast vaginitis     Discharge Instructions       Keep skin clean and dry. May apply triamcinolone twice daily x1 week. Use Hydroxyzine before bedtime. Avoid hot water as this can further dry out and irritate skin. Important to wash all clothes, bedding, blankets  in hot water. Return for worsening rash, pain, swelling, redness, fever.    ED Prescriptions    Medication Sig Dispense Auth. Provider   hydrOXYzine (ATARAX/VISTARIL) 25 MG tablet  (Status: Discontinued) Take 1 tablet (25 mg total) by mouth every 6 (six) hours. 12 tablet Hall-Potvin, [place], PA-C   fluconazole (DIFLUCAN) 150 MG tablet  (Status: Discontinued) Take 1 tablet (150 mg total) by mouth daily. May repeat in 72 hours if needed 2 tablet Hall-Potvin, [place], PA-C   fluconazole (DIFLUCAN) 150 MG tablet Take 1 tablet (150 mg total) by mouth daily. May repeat in 72 hours if needed 2 tablet Hall-Potvin, [place], PA-C   hydrOXYzine (ATARAX/VISTARIL) 25 MG tablet Take 1 tablet (25 mg total) by mouth every 6 (six) hours. 12 tablet Hall-Potvin, [place], PA-C     PDMP not reviewed this encounter.   Hall-Potvin, [place], PA-C 12/15/19 1212    Hall-Potvin, [place], [place] 12/15/19 1214

## 2019-12-14 NOTE — Discharge Instructions (Addendum)
Keep skin clean and dry. May apply triamcinolone twice daily x1 week. Use Hydroxyzine before bedtime. Avoid hot water as this can further dry out and irritate skin. Important to wash all clothes, bedding, blankets in hot water. Return for worsening rash, pain, swelling, redness, fever. 

## 2019-12-14 NOTE — ED Triage Notes (Signed)
Pt states she has been developing a rash nightly and it goes away in the daytime. Pt has pictures but currently no rash. Pt is aox4 and ambulatory.

## 2019-12-15 ENCOUNTER — Encounter: Payer: Self-pay | Admitting: Emergency Medicine

## 2020-07-26 ENCOUNTER — Ambulatory Visit: Payer: Self-pay

## 2020-07-26 ENCOUNTER — Other Ambulatory Visit: Payer: Self-pay | Admitting: Family Medicine

## 2020-07-26 ENCOUNTER — Other Ambulatory Visit: Payer: Self-pay

## 2020-07-26 DIAGNOSIS — M25571 Pain in right ankle and joints of right foot: Secondary | ICD-10-CM

## 2020-12-08 ENCOUNTER — Other Ambulatory Visit: Payer: Self-pay

## 2020-12-08 ENCOUNTER — Ambulatory Visit (HOSPITAL_COMMUNITY)
Admission: EM | Admit: 2020-12-08 | Discharge: 2020-12-08 | Discharge status: Absent without leave | Payer: BC Managed Care – PPO

## 2020-12-24 ENCOUNTER — Other Ambulatory Visit: Payer: Self-pay

## 2020-12-24 ENCOUNTER — Ambulatory Visit
Admission: RE | Admit: 2020-12-24 | Discharge: 2020-12-24 | Disposition: A | Discharge status: Home / Self Care | Payer: BC Managed Care – PPO | Source: Ambulatory Visit | Attending: Physician Assistant | Admitting: Physician Assistant

## 2020-12-24 VITALS — BP 154/98 | HR 85 | Temp 98.2°F | Resp 20

## 2020-12-24 DIAGNOSIS — L0501 Pilonidal cyst with abscess: Secondary | ICD-10-CM

## 2020-12-24 MED ORDER — DOXYCYCLINE HYCLATE 100 MG PO CAPS: 100.0000 mg | ORAL_CAPSULE | Freq: Two times a day (BID) | ORAL | 0 refills | Status: DC

## 2020-12-24 MED ORDER — MUPIROCIN 2 % EX OINT: 1.0000 "application " | TOPICAL_OINTMENT | Freq: Every day | CUTANEOUS | 0 refills | Status: AC

## 2020-12-24 NOTE — Discharge Instructions (Signed)
We drained this abscess today.  Please keep this clean and apply a clean dressing multiple times per day.  Use Bactroban ointment with dressing changes.  Use Tylenol ibuprofen for pain relief.  Start doxycycline 100 mg twice daily to cover for infection.  If anything worsens please return for reevaluation including worsening pain, fever, nausea, vomiting.

## 2020-12-24 NOTE — ED Triage Notes (Signed)
Pt c/o abscess just left of medial upper buttocks. States this has happened before and required lancing. States first noticed several days ago and has just gotten larger.   States did tele visit and received abx for the abscess that she started last Thursday.

## 2020-12-24 NOTE — ED Provider Notes (Signed)
[facility]    CSN: [id number] Arrival date & time: 12/24/20  0347      History   Chief Complaint Chief Complaint  Patient presents with   Abscess    HPI Heather Huff is a 30 y.o. female.   Patient presents today with a 1 week history of enlarging and more painful cyst in her gluteal cleft.  She has a history of pilonidal abscess requiring I&D; last procedure approximately 1 year ago.  She did a telehealth doc and was started on Bactrim which has not provided any relief of symptoms.  Pain is rated 8 on a 0-10 pain scale, localized to affected area, described as intense aching, worse with prolonged sitting, no alleviating factors identified.  Denies any fever, nausea, vomiting, abdominal pain, dizziness, headache.  Denies additional antibiotic use outside of Bactrim that she was started for this condition.  Denies history of recurrent skin infections.  Denies history of diabetes or immunosuppression.   Past Medical History:  Diagnosis Date   Allergy    seasonal   Asthma    uses inhaler prn   Hypertension    Nausea and vomiting during pregnancy 04/17/2014   Pregnant 04/17/2014   Vaginal Pap smear, abnormal     Patient Active Problem List   Diagnosis Date Noted   Severe pre-eclampsia in third trimester 10/06/2014   Chronic hypertension during pregnancy, antepartum 08/29/2014   ASCUS with positive high risk HPV 05/30/2014   Asthma    Nausea and vomiting during pregnancy 04/17/2014    History reviewed. No pertinent surgical history.  OB History     Gravida  1   Para  1   Term      Preterm  1   AB      Living  1      SAB      IAB      Ectopic      Multiple  0   Live Births  1            Home Medications    Prior to Admission medications   Medication Sig Start Date End Date Taking? Authorizing Provider  doxycycline (VIBRAMYCIN) 100 MG capsule Take 1 capsule (100 mg total) by mouth 2 (two) times daily. 12/24/20  Yes Rosellen Lichtenberger, Denny Peon K,  PA-C  mupirocin ointment (BACTROBAN) 2 % Apply 1 application topically daily. 12/24/20  Yes Avry Monteleone K, PA-C  hydrOXYzine (ATARAX/VISTARIL) 25 MG tablet Take 1 tablet (25 mg total) by mouth every 6 (six) hours. 12/14/19   Hall-Potvin, [place], PA-C  albuterol (PROVENTIL HFA;VENTOLIN HFA) 108 (90 BASE) MCG/ACT inhaler Inhale 1 puff into the lungs every 6 (six) hours as needed for wheezing or shortness of breath.   12/14/19  [provider]  enalapril (VASOTEC) 20 MG tablet Take 1 tablet (20 mg total) by mouth daily. 10/13/14 12/14/19  Tilda Burrow, MD  norethindrone (MICRONOR,CAMILA,ERRIN) 0.35 MG tablet 1 tablet by mouth at same time daily 11/12/14 12/14/19  Cheral Marker, CNM    Family History Family History  Problem Relation Age of Onset   Hypertension Mother    Thyroid disease Mother    Asthma Sister    Allergies Brother    Arthritis Maternal Grandmother    Hypertension Maternal Grandmother     Social History Social History   Tobacco Use   Smoking status: Never   Smokeless tobacco: Never  Vaping Use   Vaping Use: Never used  Substance Use Topics   Alcohol use:  No    Comment: not now   Drug use: No     Allergies   Patient has no known allergies.   Review of Systems Review of Systems  Constitutional:  Positive for activity change. Negative for appetite change, fatigue and fever.  Respiratory:  Negative for cough and shortness of breath.   Cardiovascular:  Negative for chest pain.  Gastrointestinal:  Negative for abdominal pain, diarrhea, nausea and vomiting.  Musculoskeletal:  Negative for arthralgias and myalgias.  Skin:  Positive for color change and wound.  Neurological:  Negative for dizziness, light-headedness and headaches.    Physical Exam Triage Vital Signs ED Triage Vitals  Enc Vitals Group     BP 12/24/20 1007 (!) 154/98     Pulse Rate 12/24/20 1007 85     Resp 12/24/20 1007 20     Temp 12/24/20 1007 98.2 F (36.8 C)     Temp Source  12/24/20 1007 Oral     SpO2 12/24/20 1007 98 %     Weight --      Height --      Head Circumference --      Peak Flow --      Pain Score 12/24/20 1010 0     Pain Loc --      Pain Edu? --      Excl. in GC? --    No data found.  Updated Vital Signs BP (!) 154/98 (BP Location: Left Arm)   Pulse 85   Temp 98.2 F (36.8 C) (Oral)   Resp 20   SpO2 98%   Visual Acuity Right Eye Distance:   Left Eye Distance:   Bilateral Distance:    Right Eye Near:   Left Eye Near:    Bilateral Near:     Physical Exam Vitals reviewed.  Constitutional:      General: She is awake. She is not in acute distress.    Appearance: Normal appearance. She is well-developed. She is not ill-appearing.     Comments: Very pleasant female appears stated age in no acute distress sitting comfortably in exam room  HENT:     Head: Normocephalic and atraumatic.  Cardiovascular:     Rate and Rhythm: Normal rate and regular rhythm.     Heart sounds: Normal heart sounds, S1 normal and S2 normal. No murmur heard. Pulmonary:     Effort: Pulmonary effort is normal.     Breath sounds: Normal breath sounds. No wheezing, rhonchi or rales.     Comments: Clear to auscultation bilaterally Abdominal:     General: Bowel sounds are normal.     Palpations: Abdomen is soft.     Tenderness: There is no abdominal tenderness. There is no right CVA tenderness, left CVA tenderness, guarding or rebound.  Skin:    Findings: Abscess present.          Comments: 3 cm x 2 cm abscess noted left gluteal cleft.  No streaking or evidence of lymphangitis.  No bleeding or drainage noted.  Psychiatric:        Behavior: Behavior is cooperative.     UC Treatments / Results  Labs (all labs ordered are listed, but only abnormal results are displayed) Labs Reviewed - No data to display  EKG   Radiology No results found.  Procedures Incision and Drainage  Date/Time: 12/24/2020 10:47 AM Performed by: Jeani Hawking,  PA-C Authorized by: Jeani Hawking, PA-C   Consent:    Consent obtained:  Verbal  Consent given by:  Patient   Risks, benefits, and alternatives were discussed: yes     Risks discussed:  Bleeding, incomplete drainage and infection   Alternatives discussed:  No treatment and alternative treatment Universal protocol:    Procedure explained and questions answered to patient or proxy's satisfaction: yes     Patient identity confirmed:  Verbally with patient Location:    Type:  Pilonidal cyst   Size:  3 cm x 2 cm   Location:  Anogenital   Anogenital location:  Gluteal cleft Pre-procedure details:    Skin preparation:  Chlorhexidine with alcohol Sedation:    Sedation type:  None Anesthesia:    Anesthesia method:  Local infiltration   Local anesthetic:  Lidocaine 1% w/o epi Procedure type:    Complexity:  Complex Procedure details:    Ultrasound guidance: no     Needle aspiration: no     Incision types:  Single straight   Incision depth:  Dermal   Wound management:  Probed and deloculated and irrigated with saline   Drainage:  Bloody and purulent   Drainage amount:  Moderate   Wound treatment:  Wound left open   Packing materials:  None Post-procedure details:    Procedure completion:  Tolerated well, no immediate complications (including critical care time)  Medications Ordered in UC Medications - No data to display  Initial Impression / Assessment and Plan / UC Course  I have reviewed the triage vital signs and the nursing notes.  Pertinent labs & imaging results that were available during my care of the patient were reviewed by me and considered in my medical decision making (see chart for details).     I&D performed in clinic with improvement of symptoms.  Patient was started on doxycycline 100 mg twice daily.  She was encouraged to use regular dressing changes and apply Bactroban twice daily.  Recommended she alternate Tylenol ibuprofen for pain.  She is to avoid  strenuous activity until symptoms improve and was given work excuse note.  Discussed alarm symptoms that warrant emergent evaluation.  She was encouraged to follow-up with surgeon to consider removal of pilonidal cyst given recurrent episodes and was given contact information for local provider.  Strict return precautions given to which she expressed understanding.  Final Clinical Impressions(s) / UC Diagnoses   Final diagnoses:  Pilonidal abscess     Discharge Instructions      We drained this abscess today.  Please keep this clean and apply a clean dressing multiple times per day.  Use Bactroban ointment with dressing changes.  Use Tylenol ibuprofen for pain relief.  Start doxycycline 100 mg twice daily to cover for infection.  If anything worsens please return for reevaluation including worsening pain, fever, nausea, vomiting.     ED Prescriptions     Medication Sig Dispense Auth. Provider   doxycycline (VIBRAMYCIN) 100 MG capsule Take 1 capsule (100 mg total) by mouth 2 (two) times daily. 20 capsule Treyvone Chelf K, PA-C   mupirocin ointment (BACTROBAN) 2 % Apply 1 application topically daily. 22 g Taylar Hartsough K, PA-C      PDMP not reviewed this encounter.   Jeani Hawking, PA-C 12/24/20 1048

## 2022-01-05 ENCOUNTER — Ambulatory Visit
Admission: RE | Admit: 2022-01-05 | Discharge: 2022-01-05 | Disposition: A | Discharge status: Home / Self Care | Payer: Self-pay | Source: Ambulatory Visit | Attending: Internal Medicine | Admitting: Internal Medicine

## 2022-01-05 VITALS — BP 169/126 | HR 96 | Temp 98.1°F | Resp 18

## 2022-01-05 DIAGNOSIS — R030 Elevated blood-pressure reading, without diagnosis of hypertension: Secondary | ICD-10-CM

## 2022-01-05 DIAGNOSIS — L0501 Pilonidal cyst with abscess: Secondary | ICD-10-CM

## 2022-01-05 MED ORDER — DOXYCYCLINE HYCLATE 100 MG PO CAPS: 100.0000 mg | ORAL_CAPSULE | Freq: Two times a day (BID) | ORAL | 0 refills | Status: AC

## 2022-01-05 MED ORDER — AMLODIPINE BESYLATE 5 MG PO TABS: 5.0000 mg | ORAL_TABLET | Freq: Every day | ORAL | 0 refills | Status: AC

## 2022-01-05 MED ORDER — HYDROCODONE-ACETAMINOPHEN 5-325 MG PO TABS: 1.0000 | ORAL_TABLET | Freq: Four times a day (QID) | ORAL | 0 refills | Status: AC | PRN

## 2022-01-05 NOTE — ED Triage Notes (Signed)
Pt is present today with an abscess on the lower back. Pt states she noticed the abscess x1 week ago

## 2022-01-05 NOTE — ED Provider Notes (Addendum)
[facility]    CSN: [id number] Arrival date & time: 01/05/22  1748      History   Chief Complaint Chief Complaint  Patient presents with   Abscess    I have to big Adscess on my body and they hurt unimaginably - Entered by patient    HPI Heather Huff is a 31 y.o. female.   Patient presents with abscess to upper portion of buttocks that she noticed about a week ago.  Patient reports that this has occurred approximately 4 times in the past.  Has been treated with antibiotics but has never seen general surgery.  She denies any current drainage or associated fever.  Has not taken any medications for pain.  Patient also has elevated blood pressure being.  She reports that "it is always high".  She used to take blood pressure medication multiple years ago but is not sure the name of it.  She does not currently have insurance so she does not have a PCP and does not take any daily medications.  Denies chest pain, headache, shortness of breath, dizziness, nausea, vomiting, blurred vision.   Abscess   Past Medical History:  Diagnosis Date   Allergy    seasonal   Asthma    uses inhaler prn   Hypertension    Nausea and vomiting during pregnancy 04/17/2014   Pregnant 04/17/2014   Vaginal Pap smear, abnormal     Patient Active Problem List   Diagnosis Date Noted   Severe pre-eclampsia in third trimester 10/06/2014   Chronic hypertension during pregnancy, antepartum 08/29/2014   ASCUS with positive high risk HPV 05/30/2014   Asthma    Nausea and vomiting during pregnancy 04/17/2014    History reviewed. No pertinent surgical history.  OB History     Gravida  1   Para  1   Term      Preterm  1   AB      Living  1      SAB      IAB      Ectopic      Multiple  0   Live Births  1            Home Medications    Prior to Admission medications   Medication Sig Start Date End Date Taking? Authorizing Provider  amLODipine (NORVASC) 5 MG tablet  Take 1 tablet (5 mg total) by mouth daily. 01/05/22  Yes [place] Canty, Hildred Alamin E, FNP  doxycycline (VIBRAMYCIN) 100 MG capsule Take 1 capsule (100 mg total) by mouth 2 (two) times daily. 01/05/22  Yes Konnor Jorden, Hildred Alamin E, FNP  HYDROcodone-acetaminophen (NORCO/VICODIN) 5-325 MG tablet Take 1 tablet by mouth every 6 (six) hours as needed for severe pain. 01/05/22  Yes Emmy Keng, Hildred Alamin E, FNP  hydrOXYzine (ATARAX/VISTARIL) 25 MG tablet Take 1 tablet (25 mg total) by mouth every 6 (six) hours. 12/14/19   Hall-Potvin, [place], PA-C  mupirocin ointment (BACTROBAN) 2 % Apply 1 application topically daily. 12/24/20   Raspet, Derry Skill, PA-C  albuterol (PROVENTIL HFA;VENTOLIN HFA) 108 (90 BASE) MCG/ACT inhaler Inhale 1 puff into the lungs every 6 (six) hours as needed for wheezing or shortness of breath.   12/14/19  [provider]  enalapril (VASOTEC) 20 MG tablet Take 1 tablet (20 mg total) by mouth daily. 10/13/14 12/14/19  Jonnie Kind, MD  norethindrone (MICRONOR,CAMILA,ERRIN) 0.35 MG tablet 1 tablet by mouth at same time daily 11/12/14 12/14/19  Roma Schanz, CNM    Family  History Family History  Problem Relation Age of Onset   Hypertension Mother    Thyroid disease Mother    Asthma Sister    Allergies Brother    Arthritis Maternal Grandmother    Hypertension Maternal Grandmother     Social History Social History   Tobacco Use   Smoking status: Never   Smokeless tobacco: Never  Vaping Use   Vaping Use: Never used  Substance Use Topics   Alcohol use: No    Comment: not now   Drug use: No     Allergies   Patient has no known allergies.   Review of Systems Review of Systems Per HPI  Physical Exam Triage Vital Signs ED Triage Vitals [01/05/22 1831]  Enc Vitals Group     BP (!) 169/126     Pulse Rate 96     Resp 18     Temp 98.1 F (36.7 C)     Temp src      SpO2 98 %     Weight      Height      Head Circumference      Peak Flow      Pain Score 6     Pain Loc      Pain  Edu?      Excl. in [place]?    No data found.  Updated Vital Signs BP (!) 169/126   Pulse 96   Temp 98.1 F (36.7 C)   Resp 18   SpO2 98%   Visual Acuity Right Eye Distance:   Left Eye Distance:   Bilateral Distance:    Right Eye Near:   Left Eye Near:    Bilateral Near:     Physical Exam Exam conducted with a chaperone present.  Constitutional:      General: She is not in acute distress.    Appearance: Normal appearance. She is not toxic-appearing or diaphoretic.  HENT:     Head: Normocephalic and atraumatic.  Eyes:     Extraocular Movements: Extraocular movements intact.     Conjunctiva/sclera: Conjunctivae normal.     Pupils: Pupils are equal, round, and reactive to light.  Cardiovascular:     Rate and Rhythm: Normal rate and regular rhythm.     Pulses: Normal pulses.     Heart sounds: Normal heart sounds.  Pulmonary:     Effort: Pulmonary effort is normal. No respiratory distress.     Breath sounds: Normal breath sounds.  Skin:    Comments: Patient has approximately 2 inch x 3 inch indurated area present to left top intergluteal cleft.  No drainage noted.  Neurological:     General: No focal deficit present.     Mental Status: She is alert and oriented to person, place, and time. Mental status is at baseline.     Cranial Nerves: Cranial nerves 2-12 are intact.     Sensory: Sensation is intact.     Motor: Motor function is intact.     Coordination: Coordination is intact.     Gait: Gait is intact.  Psychiatric:        Mood and Affect: Mood normal.        Behavior: Behavior normal.        Thought Content: Thought content normal.        Judgment: Judgment normal.      UC Treatments / Results  Labs (all labs ordered are listed, but only abnormal results are displayed) Labs Reviewed - No data to display  EKG   Radiology No results found.  Procedures Procedures (including critical care time)  Medications Ordered in UC Medications - No data to  display  Initial Impression / Assessment and Plan / UC Course  I have reviewed the triage vital signs and the nursing notes.  Pertinent labs & imaging results that were available during my care of the patient were reviewed by me and considered in my medical decision making (see chart for details).     Patient has pilonidal abscess on exam.  Will treat with doxycycline.  It is very indurated so not conducive for drainage.  Advised patient that it would be beneficial to follow-up with general surgery given that it is recurrent.  Also advised patient to follow-up at ER or general surgery if symptoms persist or worsen in the next 48 to 72 hours despite current treatment.  Patient requesting pain medication and will prescribe Norco.  Advised patient that it can cause drowsiness and to not drive or drink alcohol with taking it. She does not take any daily medications so this should be safe.   Patient advised to use this sparingly.  PDMP reviewed.  Patient also advised that this pain medication has Tylenol in it and to not take any additional Tylenol.  Patient has elevated blood pressure reading with recheck being 170s systolic.  After further review of patient's chart, it appears that she has had consistently elevated blood pressure.  Patient advised that she need to follow-up with PCP and appointment was made for patient.  Although, patient does not have insurance and I am concerned that she may not follow-up with PCP.  Therefore, will start amlodipine blood pressure medication today and she was encouraged to follow-up at urgent care in 1 week for recheck.  She has blood pressure cuff at home and was advised to monitor very closely and follow-up sooner if it remains elevated or she develops any associated symptoms.  She is currently asymptomatic regarding blood pressure so do not think that emergent evaluation is necessary.  Patient verbalized understanding and was agreeable with plan. Final Clinical  Impressions(s) / UC Diagnoses   Final diagnoses:  Pilonidal abscess  Elevated blood pressure reading     Discharge Instructions      I have prescribed you an antibiotic for infection.  You have also been prescribed a pain medication that you will use sparingly as needed.  Please be advised that it can cause drowsiness so do not drive or drink alcohol with it.  Follow-up with general surgery for further evaluation and management.  You have also been started on a blood pressure medication today.  Monitor your blood pressure very closely at home and write it down.  Follow-up with primary care doctor for further evaluation of blood pressure management.  You may follow-up here in 1 week if blood pressure remains elevated or sooner if it is significantly elevated.    ED Prescriptions     Medication Sig Dispense Auth. Provider   amLODipine (NORVASC) 5 MG tablet Take 1 tablet (5 mg total) by mouth daily. 90 tablet [place], [place] E, [place]   doxycycline (VIBRAMYCIN) 100 MG capsule Take 1 capsule (100 mg total) by mouth 2 (two) times daily. 20 capsule [place], Hannah E, [place]   HYDROcodone-acetaminophen (NORCO/VICODIN) 5-325 MG tablet Take 1 tablet by mouth every 6 (six) hours as needed for severe pain. 10 tablet [place], Acie Fredrickson, [place]      I have reviewed the PDMP during this encounter.   [place],  Michele Rockers, FNP 01/05/22 [place], [place], [place] 01/05/22 1958

## 2022-01-05 NOTE — Discharge Instructions (Addendum)
I have prescribed you an antibiotic for infection.  You have also been prescribed a pain medication that you will use sparingly as needed.  Please be advised that it can cause drowsiness so do not drive or drink alcohol with it.  Follow-up with general surgery for further evaluation and management.  You have also been started on a blood pressure medication today.  Monitor your blood pressure very closely at home and write it down.  Follow-up with primary care doctor for further evaluation of blood pressure management.  You may follow-up here in 1 week if blood pressure remains elevated or sooner if it is significantly elevated.

## 2022-01-13 ENCOUNTER — Ambulatory Visit: Payer: Self-pay | Admitting: Family

## 2022-01-22 ENCOUNTER — Encounter: Payer: Self-pay | Admitting: *Deleted

## 2022-04-05 IMAGING — DX DG ANKLE COMPLETE 3+V*R*
3 series · 3 of 3 positions shown · non-contrast
Comparison: None.

CLINICAL DATA: Right ankle pain

EXAM:
RIGHT ANKLE - COMPLETE 3+ VIEW

[ankle ap]
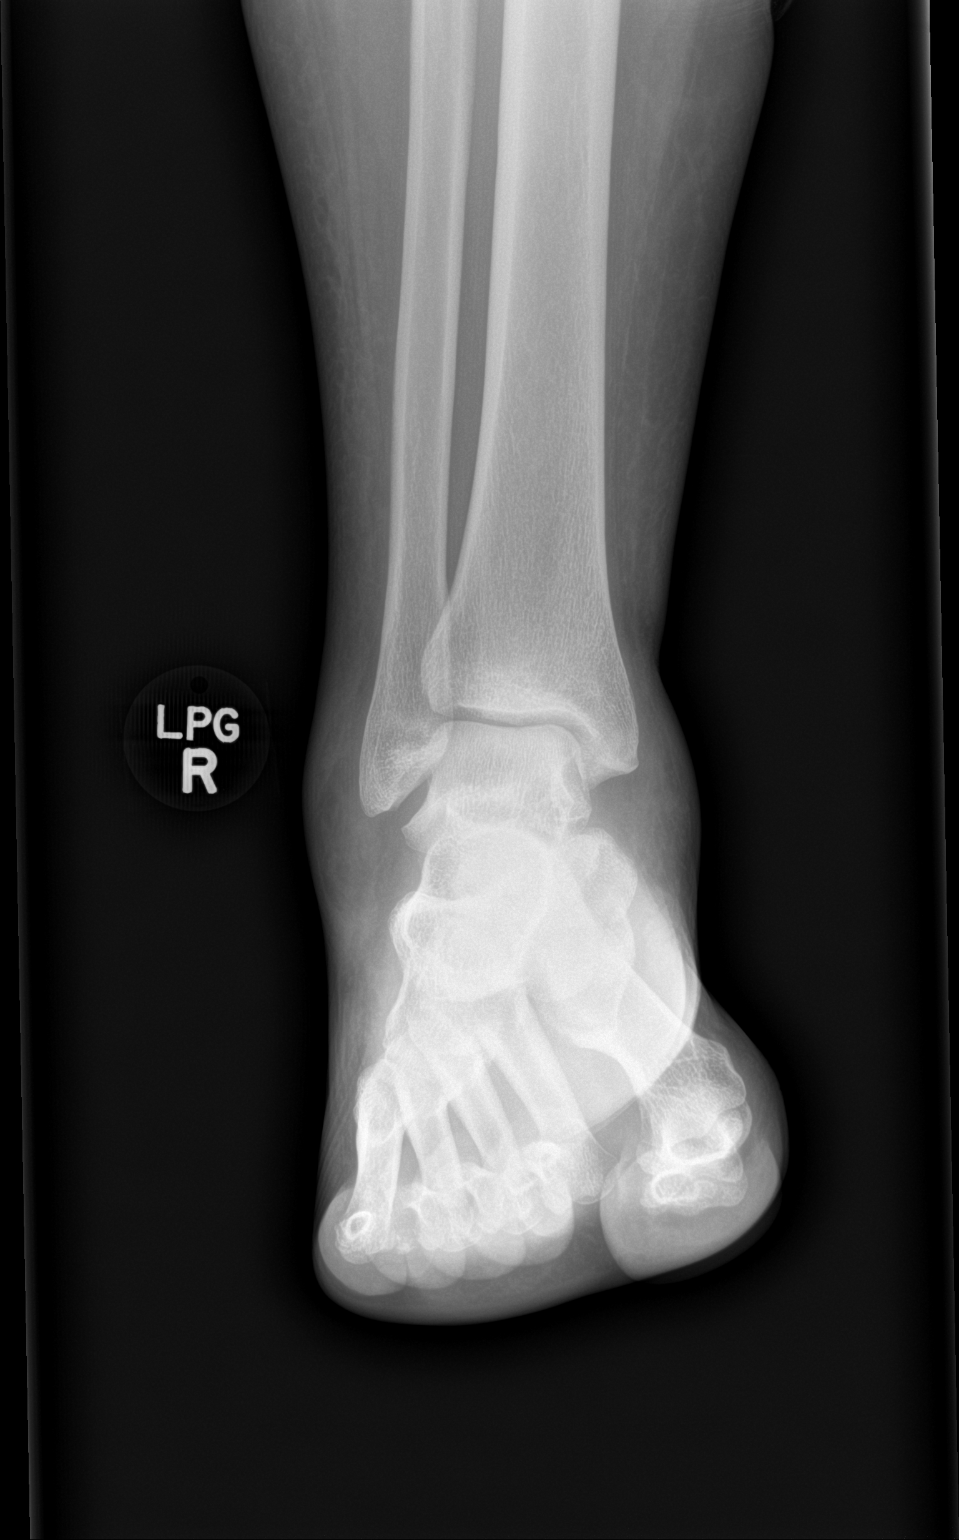

[ankle mortise]
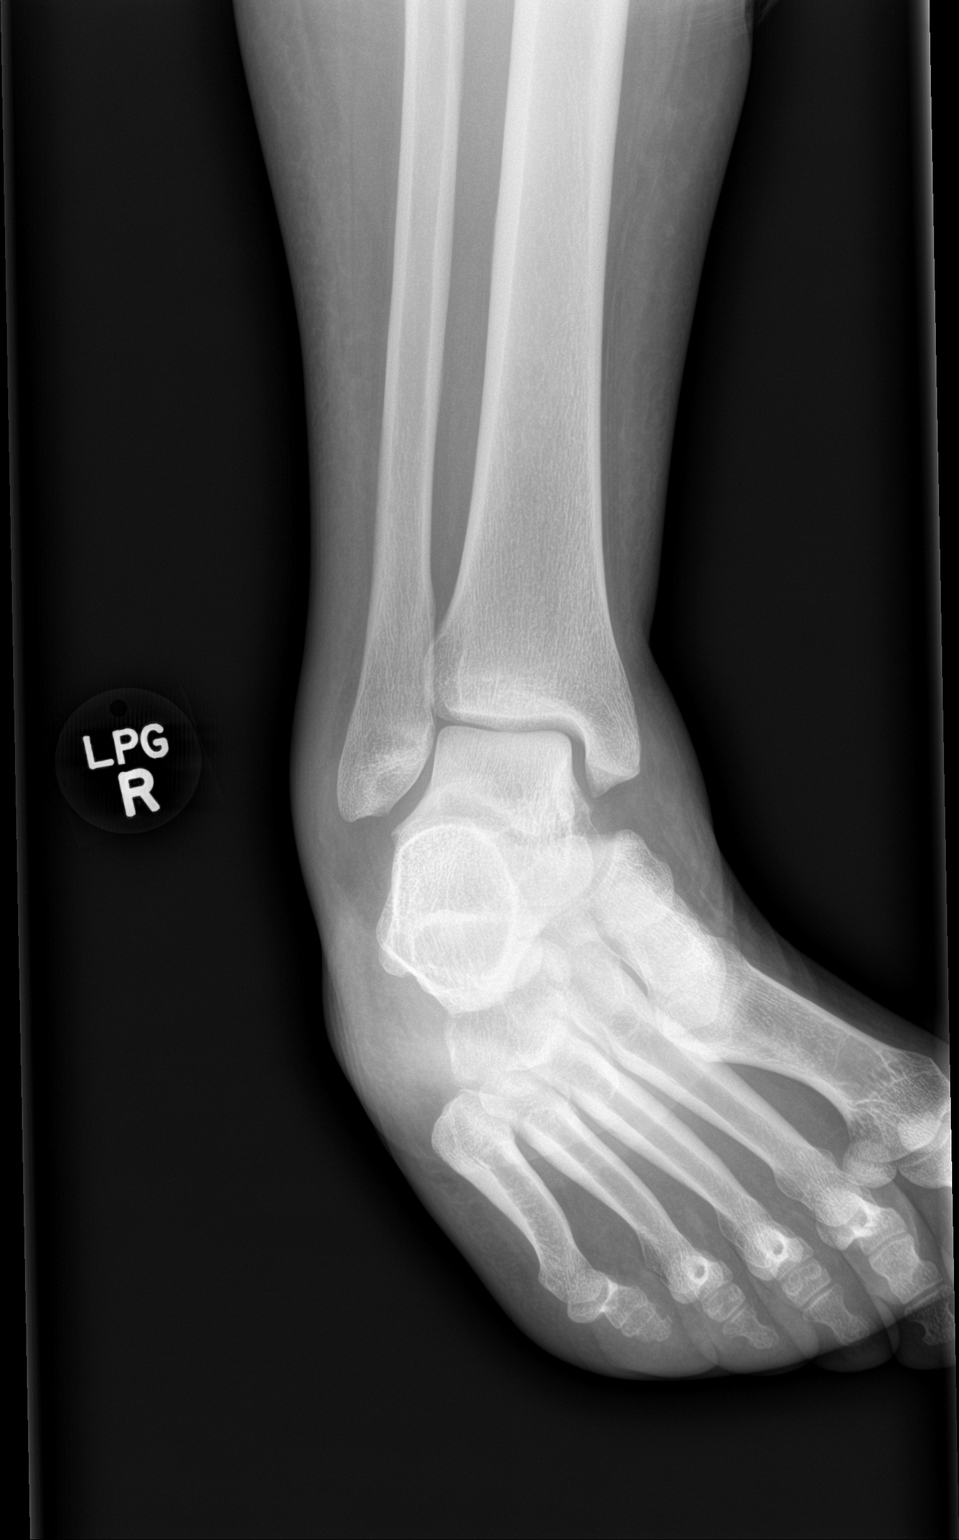

[ankle lat]
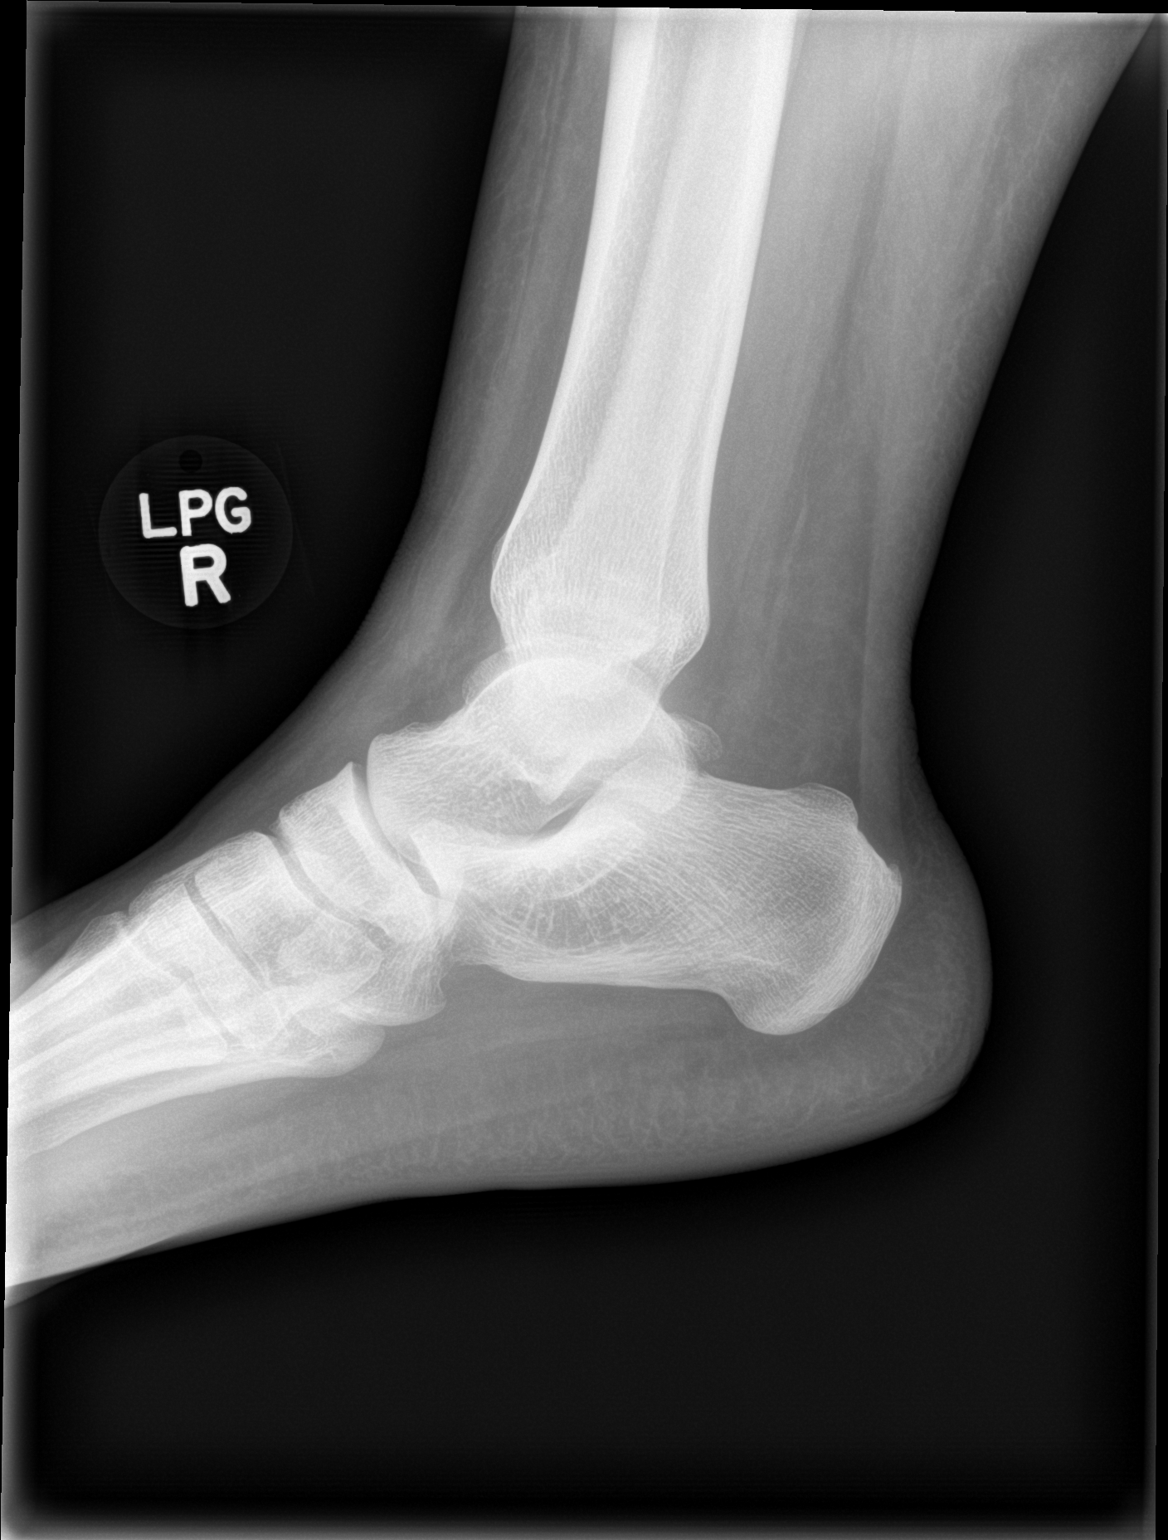

[3 of 3 positions shown; findings below may reference images not displayed]

FINDINGS: There is soft tissue swelling at the ankle. Alignment is anatomic.
No acute fracture. Joint spaces are preserved.
IMPRESSION: No acute fracture.  Soft tissue swelling.
